# Patient Record
Sex: Female | Born: 1990 | Hispanic: Yes | Marital: Married | Smoking: Never smoker
Health system: Southern US, Community
[De-identification: ages and names within clinical notes are randomized; demographics above are authoritative.]

## PROBLEM LIST (undated history)

## (undated) ENCOUNTER — Inpatient Hospital Stay (HOSPITAL_COMMUNITY): Payer: Self-pay

## (undated) HISTORY — PX: NO PAST SURGERIES: SHX2092

---

## 2016-01-02 ENCOUNTER — Emergency Department (HOSPITAL_COMMUNITY)
Admission: EM | Admit: 2016-01-02 | Discharge: 2016-01-02 | Disposition: A | Discharge status: Home / Self Care | Payer: Self-pay | Attending: Emergency Medicine | Admitting: Emergency Medicine

## 2016-01-02 ENCOUNTER — Encounter (HOSPITAL_COMMUNITY): Payer: Self-pay | Admitting: Family Medicine

## 2016-01-02 ENCOUNTER — Emergency Department (HOSPITAL_COMMUNITY): Payer: Self-pay

## 2016-01-02 DIAGNOSIS — O23511 Infections of cervix in pregnancy, first trimester: Secondary | ICD-10-CM | POA: Insufficient documentation

## 2016-01-02 DIAGNOSIS — R102 Pelvic and perineal pain: Secondary | ICD-10-CM | POA: Insufficient documentation

## 2016-01-02 DIAGNOSIS — Z3490 Encounter for supervision of normal pregnancy, unspecified, unspecified trimester: Secondary | ICD-10-CM

## 2016-01-02 DIAGNOSIS — Z3A12 12 weeks gestation of pregnancy: Secondary | ICD-10-CM | POA: Insufficient documentation

## 2016-01-02 DIAGNOSIS — O26899 Other specified pregnancy related conditions, unspecified trimester: Secondary | ICD-10-CM

## 2016-01-02 DIAGNOSIS — R109 Unspecified abdominal pain: Secondary | ICD-10-CM

## 2016-01-02 DIAGNOSIS — N760 Acute vaginitis: Secondary | ICD-10-CM

## 2016-01-02 DIAGNOSIS — N72 Inflammatory disease of cervix uteri: Secondary | ICD-10-CM

## 2016-01-02 DIAGNOSIS — O26891 Other specified pregnancy related conditions, first trimester: Secondary | ICD-10-CM | POA: Insufficient documentation

## 2016-01-02 DIAGNOSIS — O210 Mild hyperemesis gravidarum: Secondary | ICD-10-CM | POA: Insufficient documentation

## 2016-01-02 DIAGNOSIS — R1013 Epigastric pain: Secondary | ICD-10-CM | POA: Insufficient documentation

## 2016-01-02 DIAGNOSIS — B9689 Other specified bacterial agents as the cause of diseases classified elsewhere: Secondary | ICD-10-CM

## 2016-01-02 DIAGNOSIS — N390 Urinary tract infection, site not specified: Secondary | ICD-10-CM

## 2016-01-02 LAB — URINE MICROSCOPIC-ADD ON: BACTERIA UA: NONE SEEN

## 2016-01-02 LAB — WET PREP, GENITAL
Sperm: NONE SEEN
TRICH WET PREP: NONE SEEN
Yeast Wet Prep HPF POC: NONE SEEN

## 2016-01-02 LAB — URINALYSIS, ROUTINE W REFLEX MICROSCOPIC
BILIRUBIN URINE: NEGATIVE
GLUCOSE, UA: NEGATIVE mg/dL
HGB URINE DIPSTICK: NEGATIVE
KETONES UR: 40 mg/dL — AB
Nitrite: NEGATIVE
PROTEIN: NEGATIVE mg/dL
Specific Gravity, Urine: 1.023 (ref 1.005–1.030)
pH: 7.5 (ref 5.0–8.0)

## 2016-01-02 LAB — CBC
HCT: 35 % — ABNORMAL LOW (ref 36.0–46.0)
Hemoglobin: 12.3 g/dL (ref 12.0–15.0)
MCH: 28.1 pg (ref 26.0–34.0)
MCHC: 35.1 g/dL (ref 30.0–36.0)
MCV: 80.1 fL (ref 78.0–100.0)
PLATELETS: 205 10*3/uL (ref 150–400)
RBC: 4.37 MIL/uL (ref 3.87–5.11)
RDW: 12.1 % (ref 11.5–15.5)
WBC: 7.5 10*3/uL (ref 4.0–10.5)

## 2016-01-02 LAB — COMPREHENSIVE METABOLIC PANEL
ALBUMIN: 4.1 g/dL (ref 3.5–5.0)
ALT: 8 U/L — ABNORMAL LOW (ref 14–54)
ANION GAP: 8 (ref 5–15)
AST: 18 U/L (ref 15–41)
Alkaline Phosphatase: 67 U/L (ref 38–126)
BILIRUBIN TOTAL: 0.4 mg/dL (ref 0.3–1.2)
BUN: 10 mg/dL (ref 6–20)
CALCIUM: 9.4 mg/dL (ref 8.9–10.3)
CHLORIDE: 104 mmol/L (ref 101–111)
CO2: 23 mmol/L (ref 22–32)
CREATININE: 0.55 mg/dL (ref 0.44–1.00)
GLUCOSE: 89 mg/dL (ref 65–99)
POTASSIUM: 3.7 mmol/L (ref 3.5–5.1)
Sodium: 135 mmol/L (ref 135–145)
Total Protein: 7 g/dL (ref 6.5–8.1)

## 2016-01-02 LAB — LIPASE, BLOOD: Lipase: 42 U/L (ref 11–51)

## 2016-01-02 LAB — HCG, QUANTITATIVE, PREGNANCY: HCG, BETA CHAIN, QUANT, S: 108227 m[IU]/mL — AB (ref <5)

## 2016-01-02 LAB — I-STAT BETA HCG BLOOD, ED (MC, WL, AP ONLY): I-stat hCG, quantitative: >2000 m[IU]/mL — ABNORMAL HIGH (ref <5)

## 2016-01-02 LAB — RAPID HIV SCREEN (HIV 1/2 AB+AG)
HIV 1/2 ANTIBODIES: NONREACTIVE
HIV-1 P24 Antigen - HIV24: NONREACTIVE

## 2016-01-02 MED ORDER — SODIUM CHLORIDE 0.9 % IV BOLUS (SEPSIS)
1000.0000 mL | Freq: Once | INTRAVENOUS | Status: AC
  Administered 2016-01-02: 1000 mL via INTRAVENOUS

## 2016-01-02 MED ORDER — CEPHALEXIN 500 MG PO CAPS: 1000.0000 mg | ORAL_CAPSULE | Freq: Two times a day (BID) | ORAL | 0 refills | Status: DC

## 2016-01-02 MED ORDER — ALUM & MAG HYDROXIDE-SIMETH 200-200-20 MG/5ML PO SUSP
15.0000 mL | Freq: Once | ORAL | Status: AC
Start: 2016-01-02 — End: 2016-01-02
  Administered 2016-01-02: 15 mL via ORAL
  Filled 2016-01-02: qty 30

## 2016-01-02 MED ORDER — AZITHROMYCIN 250 MG PO TABS
1000.0000 mg | ORAL_TABLET | Freq: Once | ORAL | Status: AC
  Administered 2016-01-02: 1000 mg via ORAL
  Filled 2016-01-02: qty 4

## 2016-01-02 MED ORDER — METRONIDAZOLE 0.75 % VA GEL: 1.0000 | Freq: Two times a day (BID) | VAGINAL | 0 refills | Status: DC

## 2016-01-02 MED ORDER — LIDOCAINE HCL (PF) 1 % IJ SOLN
INTRAMUSCULAR | Status: AC
  Administered 2016-01-02: 5 mL
  Filled 2016-01-02: qty 5

## 2016-01-02 MED ORDER — CEFTRIAXONE SODIUM 250 MG IJ SOLR
250.0000 mg | Freq: Once | INTRAMUSCULAR | Status: AC
  Administered 2016-01-02: 250 mg via INTRAMUSCULAR
  Filled 2016-01-02: qty 250

## 2016-01-02 MED ORDER — CALCIUM CARBONATE ANTACID 600 MG PO CHEW: 600.0000 mg | CHEWABLE_TABLET | Freq: Two times a day (BID) | ORAL | 0 refills | Status: DC

## 2016-01-02 NOTE — ED Provider Notes (Signed)
[facility]-EMERGENCY DEPT Provider Note   CSN: [id number] Arrival date & time: 01/02/16  1229  First Provider Contact:  None       History   Chief Complaint Chief Complaint  Patient presents with  . Emesis  . Abdominal Pain    HPI Cindy House is a 25 y.o. female.  HPI   25 year old Hispanic female presenting for evaluation of abdominal discomfort with nausea vomiting diarrhea.  History obtain through PA student who speaks Spanish.  Pt report having postprandial epigastric abdominal discomfort associated with nausea and vomiting for the past week. She reported her pain as a burning sensation, moderate intensity sensitivity, worsened after eating. Vomitus is nonbloody nonbilious report occasional loose stools. She also report having some urinary discomfort last week with mild odor which has since resolved. Does report mild suprapubic tenderness for the past 2 days. States that her last menstrual period was 06/20. She believes she may be pregnant but have not tested yet. She has 1 child. Patient denies having fever, chills, productive cough, chest pain, shortness of breath, back pain, dysuria, hematuria, vaginal bleeding, vaginal discharge, or rash. No new sexual partners.   History reviewed. No pertinent past medical history.  There are no active problems to display for this patient.   History reviewed. No pertinent surgical history.  OB History    No data available       Home Medications    Prior to Admission medications   Not on File    Family History History reviewed. No pertinent family history.  Social History Social History  Substance Use Topics  . Smoking status: Never Smoker  . Smokeless tobacco: Never Used  . Alcohol use No     Allergies   Review of patient's allergies indicates no known allergies.   Review of Systems Review of Systems  All other systems reviewed and are negative.    Physical Exam Updated Vital Signs BP 112/65 (BP  Location: Right Arm)   Pulse 89   Temp 98.2 F (36.8 C) (Oral)   Resp 14   LMP 11/22/2015   SpO2 99%   Physical Exam  Constitutional: She appears well-developed and well-nourished. No distress.  Hispanic female laying in bed in no acute discomfort.  HENT:  Head: Atraumatic.  Eyes: Conjunctivae are normal.  Neck: Neck supple.  Cardiovascular: Normal rate and regular rhythm.   Pulmonary/Chest: Effort normal and breath sounds normal.  Abdominal: Soft. There is tenderness (Mild epigastric tenderness without guarding or rebound tenderness. Mild suprapubic tenderness without guarding or rebound tenderness.).  Genitourinary:  Genitourinary Comments: Pelvic exam: RN in room as chaperone, external female genitalia normal with no signs of lesions or injuries. Speculum exam shows normal cervix with mild dystrophic skin changes at the T-zone with mild discharge. Bimanual exam with mild L adnexal tenderness, no cervical motion tenderness, uterus normal size and nontender, no masses appreciated. The external cervical os is closed.   Neurological: She is alert.  Skin: No rash noted.  Psychiatric: She has a normal mood and affect.  Nursing note and vitals reviewed.    ED Treatments / Results  Labs (all labs ordered are listed, but only abnormal results are displayed) Labs Reviewed  WET PREP, GENITAL - Abnormal; Notable for the following:       Result Value   Clue Cells Wet Prep HPF POC PRESENT (*)    WBC, Wet Prep HPF POC MANY (*)    All other components within normal limits  COMPREHENSIVE METABOLIC PANEL -  Abnormal; Notable for the following:    ALT 8 (*)    All other components within normal limits  CBC - Abnormal; Notable for the following:    HCT 35.0 (*)    All other components within normal limits  URINALYSIS, ROUTINE W REFLEX MICROSCOPIC (NOT AT [facility]) - Abnormal; Notable for the following:    APPearance CLOUDY (*)    Ketones, ur 40 (*)    Leukocytes, UA MODERATE (*)    All other  components within normal limits  HCG, QUANTITATIVE, PREGNANCY - Abnormal; Notable for the following:    hCG, Beta Chain, Quant, S 108,227 (*)    All other components within normal limits  URINE MICROSCOPIC-ADD ON - Abnormal; Notable for the following:    Squamous Epithelial / LPF 0-5 (*)    All other components within normal limits  I-STAT BETA HCG BLOOD, ED ([facility], [facility], [facility] ONLY) - Abnormal; Notable for the following:    I-stat hCG, quantitative >2,000.0 (*)    All other components within normal limits  LIPASE, BLOOD  RAPID HIV SCREEN (HIV 1/2 AB+AG)  RPR  GC/CHLAMYDIA PROBE AMP ([place]) NOT AT [facility]    EKG  EKG Interpretation None       Radiology US Ob Limited  Result Date: 01/02/2016 CLINICAL DATA:  25 year old female with abdominal pain in the first trimester of pregnancy. Initial encounter. Quantitative beta HCG [id number]. EXAM: OBSTETRIC <14 WK [place] AND TRANSVAGINAL OB US TECHNIQUE: Both transabdominal and transvaginal ultrasound examinations were performed for complete evaluation of the gestation as well as the maternal uterus, adnexal regions, and pelvic cul-de-sac. Transvaginal technique was performed to assess early pregnancy. COMPARISON:  None. FINDINGS: Intrauterine gestational sac: Single Yolk sac:  Visible Embryo:  Visible Cardiac Activity: Detected Heart Rate: 136  bpm CRL:  12.1  mm   7 w   3 d                  [place] EDC: 08/17/2016 Subchorionic hemorrhage: 2 areas of subchorionic hemorrhage suspected measuring 2.2 cm (image 58) and 1.4 cm (image 59). Maternal uterus/adnexae: No pelvic free fluid. The right ovary measures 3.7 x 2.8 x 2.5 cm in probably contains the corpus luteum (image 73). The left ovary is only seen transabdominally and appears normal measuring 2.5 x 1.9 x 2.9 cm. IMPRESSION: 1.  Single living IUP demonstrated. 2. Two areas of subchorionic hemorrhage identified measuring up to 2 cm each. No pelvic free fluid. Electronically Signed   By: Odessa Fleming M.D.   On:  01/02/2016 19:24   US Ob Transvaginal  Result Date: 01/02/2016 CLINICAL DATA:  25 year old female with abdominal pain in the first trimester of pregnancy. Initial encounter. Quantitative beta HCG [id number]. EXAM: OBSTETRIC <14 WK [place] AND TRANSVAGINAL OB US TECHNIQUE: Both transabdominal and transvaginal ultrasound examinations were performed for complete evaluation of the gestation as well as the maternal uterus, adnexal regions, and pelvic cul-de-sac. Transvaginal technique was performed to assess early pregnancy. COMPARISON:  None. FINDINGS: Intrauterine gestational sac: Single Yolk sac:  Visible Embryo:  Visible Cardiac Activity: Detected Heart Rate: 136  bpm CRL:  12.1  mm   7 w   3 d                  [place] EDC: 08/17/2016 Subchorionic hemorrhage: 2 areas of subchorionic hemorrhage suspected measuring 2.2 cm (image 58) and 1.4 cm (image 59). Maternal uterus/adnexae: No pelvic free fluid. The right ovary measures 3.7 x 2.8 x 2.5 cm  in probably contains the corpus luteum (image 73). The left ovary is only seen transabdominally and appears normal measuring 2.5 x 1.9 x 2.9 cm. IMPRESSION: 1.  Single living IUP demonstrated. 2. Two areas of subchorionic hemorrhage identified measuring up to 2 cm each. No pelvic free fluid. Electronically Signed   By: Odessa Fleming M.D.   On: 01/02/2016 19:24    Procedures Procedures (including critical care time)  Medications Ordered in ED Medications  cefTRIAXone (ROCEPHIN) injection 250 mg (not administered)  azithromycin (ZITHROMAX) tablet 1,000 mg (not administered)  alum & mag hydroxide-simeth (MAALOX/MYLANTA) 200-200-20 MG/5ML suspension 15 mL (15 mLs Oral Given 01/02/16 1741)  sodium chloride 0.9 % bolus 1,000 mL (0 mLs Intravenous Stopped 01/02/16 1844)     Initial Impression / Assessment and Plan / ED Course  I have reviewed the triage vital signs and the nursing notes.  Pertinent labs & imaging results that were available during my care of the patient were reviewed by  me and considered in my medical decision making (see chart for details).  Clinical Course    BP 113/59   Pulse 66   Temp 98.2 F (36.8 C) (Oral)   Resp 14   LMP 11/22/2015   SpO2 100%    Final Clinical Impressions(s) / ED Diagnoses   Final diagnoses:  Abdominal pain during pregnancy  BV (bacterial vaginosis)  Hyperemesis gravidarum  UTI (lower urinary tract infection)  Cervicitis    New Prescriptions New Prescriptions   No medications on file   4:01 PM Patient here with nausea vomiting for the past week. To be brought on with eating. She is also found to have a positive pregnancy test. Her last menstrual period was June 24. Suspects her symptoms seem be hyperemesis gravidarum. Patient however appears comfortable and is currently eating cookies in the room without any discomfort.  She does have mild epigastric tenderness to suprapubic tenderness. On pelvic examination she does have left adnexal tenderness without any obvious mass and no active vaginal bleeding. Will perform transvaginal ultrasound to rule out ectopic and to assess for IUP. Patient otherwise well-appearing with stable normal vital sign.  7:51 PM Ultrasound demonstrate an intrauterine pregnancy at approximately 12 weeks which corresponds with patient's Quant hCG. Wet prep shows evidence of clue cells and many bacteria. She does have left adnexal tenderness therefore patient we treated for potential STD with Rocephin and Zithromax. She will also be treated for bacterial vaginosis with metronidazole gel. She will need to follow-up closely with open hospital for further management of her pregnancy. Patient will also be discharged with Keflex as treatment for urine tract infection. Return precaution discussed.  At this time, patient states her symptoms is much improved after receiving Maalox. She has been eating a moderate amount of food while in the ER and has not vomited once.   Fayrene Helper, PA-C 01/02/16 2007      Mancel Bale, MD 01/03/16 [phone]

## 2016-01-02 NOTE — ED Triage Notes (Signed)
Pt here for N,V,D x 1 month. LMP June 24th. Generalized abd pain. sts possible pregnancy. BP 106/69, SPO 99 RA pulse 83 RR18. CBG 113

## 2016-01-02 NOTE — ED Notes (Signed)
Pt called 2x to reassess vitals

## 2016-01-02 NOTE — ED Notes (Signed)
Pt. Eating and drinking with no issues at this time.

## 2016-01-02 NOTE — ED Notes (Signed)
Pt. Transported to ultrasound at this time.  

## 2016-01-02 NOTE — ED Notes (Addendum)
Per interpretor lines pt having vomiting and abd pain x 1 week. sts LMP June 24th. Denies pregnancy test. sts she has 1 child. sts pain is epigastric. Pt denies any vaginal bleeding or discharge. Denies urinary symptoms.

## 2016-01-02 NOTE — ED Notes (Signed)
Pt. Given drink by EDP student.

## 2016-01-02 NOTE — Discharge Instructions (Signed)
You are [redacted] weeks pregnant.  Please followup at [facility] for further management of your pregnancy.  You have a urinary tract infection.  Take Keflex as prescribed.  You also have heart burn, eat small meals, avoid spicy food and take Maalox to help with your symptoms.  You have bacterial vaginosis, use MetroGel cream twice daily for the next 1 week as treatment.  Return if you have any concerns.

## 2016-01-03 LAB — RPR: RPR: NONREACTIVE

## 2016-01-05 LAB — GC/CHLAMYDIA PROBE AMP (~~LOC~~) NOT AT ARMC
CHLAMYDIA, DNA PROBE: NEGATIVE
Neisseria Gonorrhea: NEGATIVE

## 2016-03-29 LAB — OB RESULTS CONSOLE GC/CHLAMYDIA
Chlamydia: NEGATIVE
Gonorrhea: NEGATIVE

## 2016-03-29 LAB — OB RESULTS CONSOLE VARICELLA ZOSTER ANTIBODY, IGG: Varicella: NON-IMMUNE/NOT IMMUNE

## 2016-03-29 LAB — OB RESULTS CONSOLE ANTIBODY SCREEN: Antibody Screen: NEGATIVE

## 2016-03-29 LAB — CYSTIC FIBROSIS DIAGNOSTIC STUDY: INTERPRETATION-CFDNA: NEGATIVE

## 2016-03-29 LAB — OB RESULTS CONSOLE ABO/RH: RH Type: POSITIVE

## 2016-03-29 LAB — OB RESULTS CONSOLE RUBELLA ANTIBODY, IGM: RUBELLA: IMMUNE

## 2016-03-29 LAB — OB RESULTS CONSOLE HIV ANTIBODY (ROUTINE TESTING): HIV: NONREACTIVE

## 2016-03-29 LAB — OB RESULTS CONSOLE HEPATITIS B SURFACE ANTIGEN: Hepatitis B Surface Ag: NEGATIVE

## 2016-04-07 ENCOUNTER — Encounter: Payer: Self-pay | Admitting: *Deleted

## 2016-04-13 ENCOUNTER — Encounter: Payer: Self-pay | Admitting: Obstetrics & Gynecology

## 2016-04-13 ENCOUNTER — Ambulatory Visit (INDEPENDENT_AMBULATORY_CARE_PROVIDER_SITE_OTHER): Payer: Self-pay | Admitting: Obstetrics & Gynecology

## 2016-04-13 VITALS — BP 109/55 | HR 79 | Ht 63.75 in | Wt 116.1 lb

## 2016-04-13 DIAGNOSIS — O26892 Other specified pregnancy related conditions, second trimester: Secondary | ICD-10-CM

## 2016-04-13 DIAGNOSIS — N898 Other specified noninflammatory disorders of vagina: Secondary | ICD-10-CM

## 2016-04-13 DIAGNOSIS — O09212 Supervision of pregnancy with history of pre-term labor, second trimester: Secondary | ICD-10-CM

## 2016-04-13 DIAGNOSIS — O0990 Supervision of high risk pregnancy, unspecified, unspecified trimester: Secondary | ICD-10-CM

## 2016-04-13 DIAGNOSIS — Z8751 Personal history of pre-term labor: Secondary | ICD-10-CM | POA: Insufficient documentation

## 2016-04-13 LAB — POCT URINALYSIS DIP (DEVICE)
BILIRUBIN URINE: NEGATIVE
Glucose, UA: NEGATIVE mg/dL
HGB URINE DIPSTICK: NEGATIVE
KETONES UR: NEGATIVE mg/dL
Nitrite: NEGATIVE
PH: 7 (ref 5.0–8.0)
Protein, ur: NEGATIVE mg/dL
SPECIFIC GRAVITY, URINE: 1.02 (ref 1.005–1.030)
Urobilinogen, UA: 0.2 mg/dL (ref 0.0–1.0)

## 2016-04-13 LAB — WET PREP, GENITAL
Trich, Wet Prep: NONE SEEN
YEAST WET PREP: NONE SEEN

## 2016-04-13 MED ORDER — HYDROXYPROGESTERONE CAPROATE 250 MG/ML IM OIL: 250.0000 mg | TOPICAL_OIL | INTRAMUSCULAR | Status: DC

## 2016-04-13 NOTE — Progress Notes (Signed)
Spanish video interpreter "Bevely PalmerMarianela" [phone]

## 2016-04-13 NOTE — Progress Notes (Signed)
  [facility]    Cindy House is a G2P0101 282w0d being seen today for her first obstetrical visit.  Her obstetrical history is significant for history of PTB at approximately 35 weeks. Patient does intend to breast feed. Pregnancy history fully reviewed.  Patient reports vaginal irritation and and discharge, odor.  Vitals:   04/13/16 1007 04/13/16 1015  BP: (!) 109/55   Pulse: 79   Weight: 116 lb 1.6 oz (52.7 kg)   Height:  5' 3.75" (1.619 m)    HISTORY: OB History  Gravida Para Term Preterm AB Living  2 1   1   1   SAB TAB Ectopic Multiple Live Births          1    # Outcome Date GA Lbr Len/2nd Weight Sex Delivery Anes PTL Lv  2 Current           1 Preterm 06/23/14 330w0d  4 lb 11 oz (2.126 kg) M Vag-Spont None Y LIV     Past Medical History:  Diagnosis Date  . Preterm labor    Past Surgical History:  Procedure Laterality Date  . NO PAST SURGERIES     Family History  Problem Relation Age of Onset  . Diabetes Mother      Exam    Uterus:     Pelvic Exam:    Perineum: No Hemorrhoids   Vulva: normal   Vagina:  normal mucosa, thin grey discharge   pH:     Cervix: no lesions   Adnexa: not evaluated   Bony Pelvis: average  System: Breast:      Skin: normal coloration and turgor, no rashes    Neurologic: oriented, normal mood   Extremities: normal strength, tone, and muscle mass   HEENT extra ocular movement intact   Mouth/Teeth dental hygiene good   Neck supple   Cardiovascular: regular rate and rhythm   Respiratory:  appears well, vitals normal, no respiratory distress, acyanotic, normal RR   Abdomen: gravid   Urinary: urethral meatus normal      Assessment:    Pregnancy: G2P0101 Patient Active Problem List   Diagnosis Date Noted  . History of preterm delivery 04/13/2016  . Supervision of high risk pregnancy, antepartum 04/13/2016        Plan:     Initial labs drawn. Prenatal vitamins. Problem list reviewed and  updated.  Ultrasound discussed; fetal survey: results [facility]  Follow up in 2 weeks. 50% of 30 min visit spent on counseling and coordination of care.  Start [facility] asap, [facility]   ARNOLD,JAMES 04/13/2016

## 2016-04-14 ENCOUNTER — Ambulatory Visit (HOSPITAL_COMMUNITY)
Admission: RE | Admit: 2016-04-14 | Discharge: 2016-04-14 | Disposition: A | Discharge status: Home / Self Care | Payer: Self-pay | Source: Ambulatory Visit | Attending: Obstetrics & Gynecology | Admitting: Obstetrics & Gynecology

## 2016-04-14 ENCOUNTER — Other Ambulatory Visit: Payer: Self-pay | Admitting: Obstetrics & Gynecology

## 2016-04-14 DIAGNOSIS — Z363 Encounter for antenatal screening for malformations: Secondary | ICD-10-CM

## 2016-04-14 DIAGNOSIS — Z8751 Personal history of pre-term labor: Secondary | ICD-10-CM

## 2016-04-14 DIAGNOSIS — Z3A22 22 weeks gestation of pregnancy: Secondary | ICD-10-CM | POA: Insufficient documentation

## 2016-04-14 DIAGNOSIS — O09212 Supervision of pregnancy with history of pre-term labor, second trimester: Secondary | ICD-10-CM

## 2016-04-14 DIAGNOSIS — Z3686 Encounter for antenatal screening for cervical length: Secondary | ICD-10-CM

## 2016-04-15 ENCOUNTER — Telehealth: Payer: Self-pay | Admitting: *Deleted

## 2016-04-15 DIAGNOSIS — N760 Acute vaginitis: Principal | ICD-10-CM

## 2016-04-15 DIAGNOSIS — B9689 Other specified bacterial agents as the cause of diseases classified elsewhere: Secondary | ICD-10-CM

## 2016-04-15 MED ORDER — METRONIDAZOLE 0.75 % VA GEL: 1.0000 | Freq: Every day | VAGINAL | 0 refills | Status: DC

## 2016-04-15 MED ORDER — METRONIDAZOLE 0.75 % VA GEL: 1.0000 | Freq: Every day | VAGINAL | 0 refills | Status: AC

## 2016-04-15 NOTE — Telephone Encounter (Signed)
Per Dr. Debroah LoopArnold need to tell patient needs flagy for BV. Called patient with interpreter Cindy House and informed her she has bv again and needs to take flagyl again- she requests metrogel like she had before. RX sent to health department in [place] as defaulted, called and was told she is not client there. [facility] [place]  She voices understanding.

## 2016-04-20 ENCOUNTER — Ambulatory Visit: Payer: Self-pay | Admitting: *Deleted

## 2016-04-20 NOTE — Progress Notes (Signed)
Patient presented to clinic for first 17-p injection. There was no 17-p available for her. [facility] There was no record of receiving the order in their system. Was advised to send the order again. Order was faxed, patient advised we will call her when the med arrives.

## 2016-04-21 ENCOUNTER — Telehealth: Payer: Self-pay | Admitting: *Deleted

## 2016-04-21 NOTE — Telephone Encounter (Signed)
[facility] stating that pt is "off label" in that she was already [redacted] wks EGA @ the time of her application. They are not able to provide assistance for the medication  from their program.

## 2016-04-28 ENCOUNTER — Encounter: Payer: Self-pay | Admitting: *Deleted

## 2016-04-28 ENCOUNTER — Ambulatory Visit (INDEPENDENT_AMBULATORY_CARE_PROVIDER_SITE_OTHER): Payer: Self-pay | Admitting: Family Medicine

## 2016-04-28 VITALS — BP 116/58 | HR 86 | Wt 117.5 lb

## 2016-04-28 DIAGNOSIS — O0990 Supervision of high risk pregnancy, unspecified, unspecified trimester: Secondary | ICD-10-CM

## 2016-04-28 DIAGNOSIS — Z8751 Personal history of pre-term labor: Secondary | ICD-10-CM

## 2016-04-28 DIAGNOSIS — O09212 Supervision of pregnancy with history of pre-term labor, second trimester: Secondary | ICD-10-CM

## 2016-04-28 NOTE — Progress Notes (Signed)
Used interpreter [employer].Still having some nausea.

## 2016-04-28 NOTE — Patient Instructions (Signed)
Segundo trimestre de embarazo (Second Trimester of Pregnancy) El segundo trimestre va desde la semana13 hasta la 28, desde el cuarto hasta el sexto mes, y suele ser el momento en el que mejor se siente. Su organismo se ha adaptado a estar embarazada y comienza a sentirse fsicamente mejor. En general, las nuseas matutinas han disminuido o han desaparecido completamente, puede tener ms energa y un aumento de apetito. El segundo trimestre es tambin la poca en la que el feto se desarrolla rpidamente. Hacia el final del sexto mes, el feto mide aproximadamente 9pulgadas (23cm) y pesa alrededor de 1 libras (700g). Es probable que sienta que el beb se mueve (da pataditas) entre las 18 y 20semanas del embarazo. CAMBIOS EN EL ORGANISMO Su organismo atraviesa por muchos cambios durante el embarazo, y estos varan de una mujer a otra.  Seguir aumentando de peso. Notar que la parte baja del abdomen sobresale.  Podrn aparecer las primeras estras en las caderas, el abdomen y las mamas.  Es posible que tenga dolores de cabeza que pueden aliviarse con los medicamentos que el mdico le permita tomar.  Tal vez tenga necesidad de orinar con ms frecuencia porque el feto est ejerciendo presin sobre la vejiga.  Debido al embarazo podr sentir acidez estomacal con frecuencia.  Puede estar estreida, ya que ciertas hormonas enlentecen los movimientos de los msculos que empujan los desechos a travs de los intestinos.  Pueden aparecer hemorroides o abultarse e hincharse las venas (venas varicosas).  Puede tener dolor de espalda que se debe al aumento de peso y a que las hormonas del embarazo relajan las articulaciones entre los huesos de la pelvis, y como consecuencia de la modificacin del peso y los msculos que mantienen el equilibrio.  Las mamas seguirn creciendo y le dolern.  Las encas pueden sangrar y estar sensibles al cepillado y al hilo dental.  Pueden aparecer zonas oscuras o  manchas (cloasma, mscara del embarazo) en el rostro que probablemente se atenuar despus del nacimiento del beb.  Es posible que se forme una lnea oscura desde el ombligo hasta la zona del pubis (linea nigra) que probablemente se atenuar despus del nacimiento del beb.  Tal vez haya cambios en el cabello que pueden incluir su engrosamiento, crecimiento rpido y cambios en la textura. Adems, a algunas mujeres se les cae el cabello durante o despus del embarazo, o tienen el cabello seco o fino. Lo ms probable es que el cabello se le normalice despus del nacimiento del beb. QU DEBE ESPERAR EN LAS CONSULTAS PRENATALES Durante una visita prenatal de rutina:  La pesarn para asegurarse de que usted y el feto estn creciendo normalmente.  Le tomarn la presin arterial.  Le medirn el abdomen para controlar el desarrollo del beb.  Se escucharn los latidos cardacos fetales.  Se evaluarn los resultados de los estudios solicitados en visitas anteriores. El mdico puede preguntarle lo siguiente:  Cmo se siente.  Si siente los movimientos del beb.  Si ha tenido sntomas anormales, como prdida de lquido, sangrado, dolores de cabeza intensos o clicos abdominales.  Si est consumiendo algn producto que contenga tabaco, como cigarrillos, tabaco de mascar y cigarrillos electrnicos.  Si tiene alguna pregunta. Otros estudios que podrn realizarse durante el segundo trimestre incluyen lo siguiente:  Anlisis de sangre para detectar lo siguiente:  Concentraciones de hierro bajas (anemia).  Diabetes gestacional (entre la semana 24 y la 28).  Anticuerpos Rh.  Anlisis de orina para detectar infecciones, diabetes o protenas en la orina.    Una ecografa para confirmar que el beb crece y se desarrolla correctamente.  Una amniocentesis para diagnosticar posibles problemas genticos.  Estudios del feto para descartar espina bfida y sndrome de Down.  Prueba del VIH (virus  de inmunodeficiencia humana). Los exmenes prenatales de rutina incluyen la prueba de deteccin del VIH, a menos que decida no realizrsela. INSTRUCCIONES PARA EL CUIDADO EN EL HOGAR  Evite fumar, consumir hierbas, beber alcohol y tomar frmacos que no le hayan recetado. Estas sustancias qumicas afectan la formacin y el desarrollo del beb.  No consuma ningn producto que contenga tabaco, lo que incluye cigarrillos, tabaco de mascar y cigarrillos electrnicos. Si necesita ayuda para dejar de fumar, consulte al mdico. Puede recibir asesoramiento y otro tipo de recursos para dejar de fumar.  Siga las indicaciones del mdico en relacin con el uso de medicamentos. Durante el embarazo, hay medicamentos que son seguros de tomar y otros que no.  Haga ejercicio solamente como se lo haya indicado el mdico. Sentir clicos uterinos es un buen signo para detener la actividad fsica.  Contine comiendo alimentos sanos con regularidad.  Use un sostn que le brinde buen soporte si le duelen las mamas.  No se d baos de inmersin en agua caliente, baos turcos ni saunas.  Use el cinturn de seguridad en todo momento mientras conduce.  No coma carne cruda ni queso sin cocinar; evite el contacto con las bandejas sanitarias de los gatos y la tierra que estos animales usan. Estos elementos contienen grmenes que pueden causar defectos congnitos en el beb.  Tome las vitaminas prenatales.  Tome entre 1500 y 2000mg de calcio diariamente comenzando en la semana20 del embarazo hasta el parto.  Si est estreida, pruebe un laxante suave (si el mdico lo autoriza). Consuma ms alimentos ricos en fibra, como vegetales y frutas frescos y cereales integrales. Beba gran cantidad de lquido para mantener la orina de tono claro o color amarillo plido.  Dese baos de asiento con agua tibia para aliviar el dolor o las molestias causadas por las hemorroides. Use una crema para las hemorroides si el mdico la  autoriza.  Si tiene venas varicosas, use medias de descanso. Eleve los pies durante 15minutos, 3 o 4veces por da. Limite el consumo de sal en su dieta.  No levante objetos pesados, use zapatos de tacones bajos y mantenga una buena postura.  Descanse con las piernas elevadas si tiene calambres o dolor de cintura.  Visite a su dentista si an no lo ha hecho durante el embarazo. Use un cepillo de dientes blando para higienizarse los dientes y psese el hilo dental con suavidad.  Puede seguir manteniendo relaciones sexuales, a menos que el mdico le indique lo contrario.  Concurra a todas las visitas prenatales segn las indicaciones de su mdico. SOLICITE ATENCIN MDICA SI:  Tiene mareos.  Siente clicos leves, presin en la pelvis o dolor persistente en el abdomen.  Tiene nuseas, vmitos o diarrea persistentes.  Observa una secrecin vaginal con mal olor.  Siente dolor al orinar. SOLICITE ATENCIN MDICA DE INMEDIATO SI:  Tiene fiebre.  Tiene una prdida de lquido por la vagina.  Tiene sangrado o pequeas prdidas vaginales.  Siente dolor intenso o clicos en el abdomen.  Sube o baja de peso rpidamente.  Tiene dificultad para respirar y siente dolor de pecho.  Sbitamente se le hinchan mucho el rostro, las manos, los tobillos, los pies o las piernas.  No ha sentido los movimientos del beb durante una hora.  Siente un   dolor de cabeza intenso que no se alivia con medicamentos.  Su visin se modifica. Esta informacin no tiene como fin reemplazar el consejo del mdico. Asegrese de hacerle al mdico cualquier pregunta que tenga. Document Released: 02/24/2005 Document Revised: 06/07/2014 Document Reviewed: 07/18/2012 Elsevier Interactive Patient Education  2017 [employer]   Lactancia materna (Breastfeeding) Decidir amamantar es una de las mejores elecciones que puede hacer por usted y su beb. El cambio hormonal durante el embarazo produce el desarrollo del  tejido mamario y aumenta la cantidad y el tamao de los conductos galactforos. Estas hormonas tambin permiten que las protenas, los azcares y las grasas de la sangre produzcan la leche materna en las glndulas productoras de leche. Las hormonas impiden que la leche materna sea liberada antes del nacimiento del beb, adems de impulsar el flujo de leche luego del nacimiento. Una vez que ha comenzado a amamantar, pensar en el beb, as como la succin o el llanto, pueden estimular la liberacin de leche de las glndulas productoras de leche. LOS BENEFICIOS DE AMAMANTAR Para el beb   La primera leche (calostro) ayuda a mejorar el funcionamiento del sistema digestivo del beb.  La leche tiene anticuerpos que ayudan a prevenir las infecciones en el beb.  El beb tiene una menor incidencia de asma, alergias y del sndrome de muerte sbita del lactante.  Los nutrientes en la leche materna son mejores para el beb que la leche maternizada y estn preparados exclusivamente para cubrir las necesidades del beb.  La leche materna mejora el desarrollo cerebral del beb.  Es menos probable que el beb desarrolle otras enfermedades, como obesidad infantil, asma o diabetes mellitus de tipo 2. Para usted   La lactancia materna favorece el desarrollo de un vnculo muy especial entre la madre y el beb.  Es conveniente. La leche materna siempre est disponible a la temperatura correcta y es econmica.  La lactancia materna ayuda a quemar caloras y a perder el peso ganado durante el embarazo.  Favorece la contraccin del tero al tamao que tena antes del embarazo de manera ms rpida y disminuye el sangrado (loquios) despus del parto.  La lactancia materna contribuye a reducir el riesgo de desarrollar diabetes mellitus de tipo 2, osteoporosis o cncer de mama o de ovario en el futuro. SIGNOS DE QUE EL BEB EST HAMBRIENTO Primeros signos de hambre   Aumenta su estado de alerta o actividad.  Se  estira.  Mueve la cabeza de un lado a otro.  Mueve la cabeza y abre la boca cuando se le toca la mejilla o la comisura de la boca (reflejo de bsqueda).  Aumenta las vocalizaciones, tales como sonidos de succin, se relame los labios, emite arrullos, suspiros, o chirridos.  Mueve la mano hacia la boca.  Se chupa con ganas los dedos o las manos. Signos tardos de hambre   Est agitado.  Llora de manera intermitente. Signos de hambre extrema  Los signos de hambre extrema requerirn que lo calme y lo consuele antes de que el beb pueda alimentarse adecuadamente. No espere a que se manifiesten los siguientes signos de hambre extrema para comenzar a amamantar:  Agitacin.  Llanto intenso y fuerte.  Gritos. INFORMACIN BSICA SOBRE LA LACTANCIA MATERNA Iniciacin de la lactancia materna   Encuentre un lugar cmodo para sentarse o acostarse, con un buen respaldo para el cuello y la espalda.  Coloque una almohada o una manta enrollada debajo del beb para acomodarlo a la altura de la mama (si est sentada).   Las almohadas para amamantar se han diseado especialmente a fin de servir de apoyo para los brazos y el beb mientras amamanta.  Asegrese de que el abdomen del beb est frente al suyo.  Masajee suavemente la mama. Con las yemas de los dedos, masajee la pared del pecho hacia el pezn en un movimiento circular. Esto estimula el flujo de leche. Es posible que deba continuar este movimiento mientras amamanta si la leche fluye lentamente.  Sostenga la mama con el pulgar por arriba del pezn y los otros 4 dedos por debajo de la mama. Asegrese de que los dedos se encuentren lejos del pezn y de la boca del beb.  Empuje suavemente los labios del beb con el pezn o con el dedo.  Cuando la boca del beb se abra lo suficiente, acrquelo rpidamente a la mama e introduzca todo el pezn y la zona oscura que lo rodea (areola), tanto como sea posible, dentro de la boca del beb.  Debe  haber ms areola visible por arriba del labio superior del beb que por debajo del labio inferior.  La lengua del beb debe estar entre la enca inferior y la mama.  Asegrese de que la boca del beb est en la posicin correcta alrededor del pezn (prendida). Los labios del beb deben crear un sello sobre la mama y estar doblados hacia afuera (invertidos).  Es comn que el beb succione durante 2 a 3 minutos para que comience el flujo de leche materna. Cmo debe prenderse  Es muy importante que le ensee al beb cmo prenderse adecuadamente a la mama. Si el beb no se prende adecuadamente, puede causarle dolor en el pezn y reducir la produccin de leche materna, y hacer que el beb tenga un escaso aumento de peso. Adems, si el beb no se prende adecuadamente al pezn, puede tragar aire durante la alimentacin. Esto puede causarle molestias al beb. Hacer eructar al beb al cambiar de mama puede ayudarlo a liberar el aire. Sin embargo, ensearle al beb cmo prenderse a la mama adecuadamente es la mejor manera de evitar que se sienta molesto por tragar aire mientras se alimenta. Signos de que el beb se ha prendido adecuadamente al pezn:  Tironea o succiona de modo silencioso, sin causarle dolor.  Se escucha que traga cada 3 o 4 succiones.  Hay movimientos musculares por arriba y por delante de sus odos al succionar. Signos de que el beb no se ha prendido adecuadamente al pezn:  Hace ruidos de succin o de chasquido mientras se alimenta.  Siente dolor en el pezn. Si cree que el beb no se prendi correctamente, deslice el dedo en la comisura de la boca y colquelo entre las encas del beb para interrumpir la succin. Intente comenzar a amamantar nuevamente. Signos de lactancia materna exitosa  Signos del beb:  Disminuye gradualmente el nmero de succiones o cesa la succin por completo.  Se duerme.  Relaja el cuerpo.  Retiene una pequea cantidad de leche en la boca.  Se  desprende solo del pecho. Signos que presenta usted:  Las mamas han aumentado la firmeza, el peso y el tamao 1 a 3 horas despus de amamantar.  Estn ms blandas inmediatamente despus de amamantar.  Un aumento del volumen de leche, y tambin un cambio en su consistencia y color se producen hacia el quinto da de lactancia materna.  Los pezones no duelen, ni estn agrietados ni sangran. Signos de que su beb recibe la cantidad de leche suficiente   Mojar   por lo menos 1 o 2 paales durante las primeras 24 horas despus del nacimiento.  Mojar por lo menos 5 o 6 paales cada 24 horas durante la primera semana despus del nacimiento. La orina debe ser transparente o de color amarillo plido a los 5 das despus del nacimiento.  Mojar entre 6 y 8 paales cada 24 horas a medida que el beb sigue creciendo y desarrollndose.  Defeca al menos 3 veces en 24 horas a los 5 das de vida. La materia fecal debe ser blanda y amarillenta.  Defeca al menos 3 veces en 24 horas a los 7 das de vida. La materia fecal debe ser grumosa y amarillenta.  No registra una prdida de peso mayor del 10% del peso al nacer durante los primeros 3 das de vida.  Aumenta de peso un promedio de 4 a 7onzas (113 a 198g) por semana despus de los 4 das de vida.  Aumenta de peso, diariamente, de manera uniforme a partir de los 5 das de vida, sin registrar prdida de peso despus de las 2semanas de vida. Despus de alimentarse, es posible que el beb regurgite una pequea cantidad. Esto es frecuente. FRECUENCIA Y DURACIN DE LA LACTANCIA MATERNA El amamantamiento frecuente la ayudar a producir ms leche y a prevenir problemas de dolor en los pezones e hinchazn en las mamas. Alimente al beb cuando muestre signos de hambre o si siente la necesidad de reducir la congestin de las mamas. Esto se denomina "lactancia a demanda". Evite el uso del chupete mientras trabaja para establecer la lactancia (las primeras 4 a 6  semanas despus del nacimiento del beb). Despus de este perodo, podr ofrecerle un chupete. Las investigaciones demostraron que el uso del chupete durante el primer ao de vida del beb disminuye el riesgo de desarrollar el sndrome de muerte sbita del lactante (SMSL). Permita que el nio se alimente en cada mama todo lo que desee. Contine amamantando al beb hasta que haya terminado de alimentarse. Cuando el beb se desprende o se queda dormido mientras se est alimentando de la primera mama, ofrzcale la segunda. Debido a que, con frecuencia, los recin nacidos permanecen somnolientos las primeras semanas de vida, es posible que deba despertar al beb para alimentarlo. Los horarios de lactancia varan de un beb a otro. Sin embargo, las siguientes reglas pueden servir como gua para ayudarla a garantizar que el beb se alimenta adecuadamente:  Se puede amamantar a los recin nacidos (bebs de 4 semanas o menos de vida) cada 1 a 3 horas.  No deben transcurrir ms de 3 horas durante el da o 5 horas durante la noche sin que se amamante a los recin nacidos.  Debe amamantar al beb 8 veces como mnimo en un perodo de 24 horas, hasta que comience a introducir slidos en su dieta, a los 6 meses de vida aproximadamente. EXTRACCIN DE LECHE MATERNA La extraccin y el almacenamiento de la leche materna le permiten asegurarse de que el beb se alimente exclusivamente de leche materna, aun en momentos en los que no puede amamantar. Esto tiene especial importancia si debe regresar al trabajo en el perodo en que an est amamantando o si no puede estar presente en los momentos en que el beb debe alimentarse. Su asesor en lactancia puede orientarla sobre cunto tiempo es seguro almacenar leche materna. El sacaleche es un aparato que le permite extraer leche de la mama a un recipiente estril. Luego, la leche materna extrada puede almacenarse en un refrigerador o congelador.   Algunos sacaleches son manuales,  mientras que otros son elctricos. Consulte a su asesor en lactancia qu tipo ser ms conveniente para usted. Los sacaleches se pueden comprar; sin embargo, algunos hospitales y grupos de apoyo a la lactancia materna alquilan sacaleches mensualmente. Un asesor en lactancia puede ensearle cmo extraer leche materna manualmente, en caso de que prefiera no usar un sacaleche. CMO CUIDAR LAS MAMAS DURANTE LA LACTANCIA MATERNA Los pezones se secan, agrietan y duelen durante la lactancia materna. Las siguientes recomendaciones pueden ayudarla a mantener las mamas humectadas y sanas:  Evite usar jabn en los pezones.  Use un sostn de soporte. Aunque no son esenciales, las camisetas sin mangas o los sostenes especiales para amamantar estn diseados para acceder fcilmente a las mamas, para amamantar sin tener que quitarse todo el sostn o la camiseta. Evite usar sostenes con aro o sostenes muy ajustados.  Seque al aire sus pezones durante 3 a 4minutos despus de amamantar al beb.  Utilice solo apsitos de algodn en el sostn para absorber las prdidas de leche. La prdida de un poco de leche materna entre las tomas es normal.  Utilice lanolina sobre los pezones luego de amamantar. La lanolina ayuda a mantener la humedad normal de la piel. Si usa lanolina pura, no tiene que lavarse los pezones antes de volver a alimentar al beb. La lanolina pura no es txica para el beb. Adems, puede extraer manualmente algunas gotas de leche materna y masajear suavemente esa leche sobre los pezones, para que la leche se seque al aire. Durante las primeras semanas despus de dar a luz, algunas mujeres pueden experimentar hinchazn en las mamas (congestin mamaria). La congestin puede hacer que sienta las mamas pesadas, calientes y sensibles al tacto. El pico de la congestin ocurre dentro de los 3 a 5 das despus del parto. Las siguientes recomendaciones pueden ayudarla a aliviar la congestin:  Vace por completo  las mamas al amamantar o extraer leche. Puede aplicar calor hmedo en las mamas (en la ducha o con toallas hmedas para manos) antes de amamantar o extraer leche. Esto aumenta la circulacin y ayuda a que la leche fluya. Si el beb no vaca por completo las mamas cuando lo amamanta, extraiga la leche restante despus de que haya finalizado.  Use un sostn ajustado (para amamantar o comn) o una camiseta sin mangas durante 1 o 2 das para indicar al cuerpo que disminuya ligeramente la produccin de leche.  Aplique compresas de hielo sobre las mamas, a menos que le resulte demasiado incmodo.  Asegrese de que el beb est prendido y se encuentre en la posicin correcta mientras lo alimenta. Si la congestin persiste luego de 48 horas o despus de seguir estas recomendaciones, comunquese con su mdico o un asesor en lactancia. RECOMENDACIONES GENERALES PARA EL CUIDADO DE LA SALUD DURANTE LA LACTANCIA MATERNA  Consuma alimentos saludables. Alterne comidas y colaciones, y coma 3 de cada una por da. Dado que lo que come afecta la leche materna, es posible que algunas comidas hagan que su beb se vuelva ms irritable de lo habitual. Evite comer este tipo de alimentos si percibe que afectan de manera negativa al beb.  Beba leche, jugos de fruta y agua para satisfacer su sed (aproximadamente 10 vasos al da).  Descanse con frecuencia, reljese y tome sus vitaminas prenatales para evitar la fatiga, el estrs y la anemia.  Contine con los autocontroles de la mama.  Evite masticar y fumar tabaco. Las sustancias qumicas de los cigarrillos que pasan   a la leche materna y la exposicin al humo ambiental del tabaco pueden daar al beb.  No consuma alcohol ni drogas, incluida la marihuana. Algunos medicamentos, que pueden ser perjudiciales para el beb, pueden pasar a travs de la leche materna. Es importante que consulte a su mdico antes de tomar cualquier medicamento, incluidos todos los medicamentos  recetados y de venta libre, as como los suplementos vitamnicos y herbales. Puede quedar embarazada durante la lactancia. Si desea controlar la natalidad, consulte a su mdico cules son las opciones ms seguras para el beb. SOLICITE ATENCIN MDICA SI:  Usted siente que quiere dejar de amamantar o se siente frustrada con la lactancia.  Siente dolor en las mamas o en los pezones.  Sus pezones estn agrietados o sangran.  Sus pechos estn irritados, sensibles o calientes.  Tiene un rea hinchada en cualquiera de las mamas.  Siente escalofros o fiebre.  Tiene nuseas o vmitos.  Presenta una secrecin de otro lquido distinto de la leche materna de los pezones.  Sus mamas no se llenan antes de amamantar al beb para el quinto da despus del parto.  Se siente triste y deprimida.  El beb est demasiado somnoliento como para comer bien.  El beb tiene problemas para dormir.  Moja menos de 3 paales en 24 horas.  Defeca menos de 3 veces en 24 horas.  La piel del beb o la parte blanca de los ojos se vuelven amarillentas.  El beb no ha aumentado de peso a los 5 das de vida. SOLICITE ATENCIN MDICA DE INMEDIATO SI:  El beb est muy cansado (letargo) y no se quiere despertar para comer.  Le sube la fiebre sin causa. Esta informacin no tiene como fin reemplazar el consejo del mdico. Asegrese de hacerle al mdico cualquier pregunta que tenga. Document Released: 05/17/2005 Document Revised: 09/08/2015 Document Reviewed: 11/08/2012 Elsevier Interactive Patient Education  2017 [employer]  

## 2016-04-28 NOTE — Progress Notes (Signed)
   PRENATAL VISIT NOTE  Subjective:  Cindy House is a 25 y.o. G2P0101 at 5919w1d being seen today for ongoing prenatal care.  She is currently monitored for the following issues for this high-risk pregnancy and has History of preterm delivery and Supervision of high risk pregnancy, antepartum on her problem list.  Patient reports no complaints.  Contractions: Not present. Vag. Bleeding: None.  Movement: Present. Denies leaking of fluid.   The following portions of the patient's history were reviewed and updated as appropriate: allergies, current medications, past family history, past medical history, past social history, past surgical history and problem list. Problem list updated.  Objective:   Vitals:   04/28/16 1358  BP: (!) 116/58  Pulse: 86  Weight: 117 lb 8 oz (53.3 kg)    Fetal Status: Fetal Heart Rate (bpm): 145 Fundal Height: 25 cm Movement: Present     General:  Alert, oriented and cooperative. Patient is in no acute distress.  Skin: Skin is warm and dry. No rash noted.   Cardiovascular: Normal heart rate noted  Respiratory: Normal respiratory effort, no problems with respiration noted  Abdomen: Soft, gravid, appropriate for gestational age. Pain/Pressure: Absent     Pelvic:  Cervical exam deferred        Extremities: Normal range of motion.  Edema: None  Mental Status: Normal mood and affect. Normal behavior. Normal judgment and thought content.   Assessment and Plan:  Pregnancy: G2P0101 at 4619w1d  1. History of preterm delivery Not a candidate for 17 P, declines prometrium  2. Supervision of high risk pregnancy, antepartum 28 wk labs and TDaP at next visit.  Preterm labor symptoms and general obstetric precautions including but not limited to vaginal bleeding, contractions, leaking of fluid and fetal movement were reviewed in detail with the patient. Please refer to After Visit Summary for other counseling recommendations.  Return in about 4 weeks (around  05/26/2016) for 28 wk labs.   Reva Boresanya S Jaydian Santana, MD

## 2016-05-12 ENCOUNTER — Encounter: Payer: Self-pay | Admitting: General Practice

## 2016-05-26 ENCOUNTER — Encounter: Payer: Self-pay | Admitting: Family

## 2016-05-31 NOTE — L&D Delivery Note (Signed)
Delivery Note  Delivery of a viable female at 360426 by CNM in LOA position no nuchal cord Cord double clamped after cessation of pulsation, cut by FOB Cord blood sample collected   Third Stage: Placenta delivered Duncan intact with 3 VC @ 0431 Placenta disposition: L&D Uterine tone firm / bleeding large- IM pitocin and cytotec given   Left labial laceration identified  Anesthesia for repair: none Repair none Est. Blood Loss (mL): 400cc  Complications: none  Mom to postpartum.  Baby to Couplet care / Skin to Skin.  Newborn: Birth Weight: pending  Apgar Scores: 8/9 Feeding planned: breast  Steward DroneVeronica Rogers BSN, SNM 08/07/2016, 4:47 AM  Patient is a G2P0101 at 7044w4d who was admitted in transition, significant hx of preterm delivery at 35-36wks but otherwise uncomplicated prenatal course.  She progressed without augmentation to SVD.  I was gloved and present for delivery in its entirety.  Second stage of labor progressed, baby delivered after pushing approx 25 mins.  Mild decels during second stage noted.  Complications: none  Lacerations: left labial, not repaired  EBL: 400cc (given rectal cytotec 800mcg due to brisk bldg immed PP and no IV placement)  SHAW, KIMBERLY, CNM 10:00 AM  08/07/2016

## 2016-06-07 ENCOUNTER — Ambulatory Visit (INDEPENDENT_AMBULATORY_CARE_PROVIDER_SITE_OTHER): Payer: Self-pay | Admitting: Obstetrics & Gynecology

## 2016-06-07 VITALS — BP 110/60 | HR 83 | Wt 127.5 lb

## 2016-06-07 DIAGNOSIS — O0990 Supervision of high risk pregnancy, unspecified, unspecified trimester: Secondary | ICD-10-CM

## 2016-06-07 DIAGNOSIS — O09213 Supervision of pregnancy with history of pre-term labor, third trimester: Secondary | ICD-10-CM

## 2016-06-07 DIAGNOSIS — Z8751 Personal history of pre-term labor: Secondary | ICD-10-CM

## 2016-06-07 DIAGNOSIS — O0992 Supervision of high risk pregnancy, unspecified, second trimester: Secondary | ICD-10-CM

## 2016-06-07 LAB — CBC
HEMATOCRIT: 31.1 % — AB (ref 35.0–45.0)
Hemoglobin: 10.2 g/dL — ABNORMAL LOW (ref 11.7–15.5)
MCH: 29.3 pg (ref 27.0–33.0)
MCHC: 32.8 g/dL (ref 32.0–36.0)
MCV: 89.4 fL (ref 80.0–100.0)
MPV: 9.3 fL (ref 7.5–12.5)
Platelets: 224 10*3/uL (ref 140–400)
RBC: 3.48 MIL/uL — AB (ref 3.80–5.10)
RDW: 12.8 % (ref 11.0–15.0)
WBC: 9.5 10*3/uL (ref 3.8–10.8)

## 2016-06-07 NOTE — Progress Notes (Signed)
   PRENATAL VISIT NOTE  Subjective:  Cindy House is a 26 y.o. G2P0101 at 9268w6d being seen today for ongoing prenatal care.  She is currently monitored for the following issues for this high-risk pregnancy and has History of preterm delivery and Supervision of high risk pregnancy, antepartum on her problem list.  Patient reports low abdominal pressure.   .  .  Movement: Present. Denies leaking of fluid.   The following portions of the patient's history were reviewed and updated as appropriate: allergies, current medications, past family history, past medical history, past social history, past surgical history and problem list. Problem list updated.  Objective:   Vitals:   06/07/16 0842  BP: 110/60  Pulse: 83  Weight: 127 lb 8 oz (57.8 kg)    Fetal Status: Fetal Heart Rate (bpm): 142   Movement: Present     General:  Alert, oriented and cooperative. Patient is in no acute distress.  Skin: Skin is warm and dry. No rash noted.   Cardiovascular: Normal heart rate noted  Respiratory: Normal respiratory effort, no problems with respiration noted  Abdomen: Soft, gravid, appropriate for gestational age. Pain/Pressure: Present     Pelvic:  Cervical exam deferred        Extremities: Normal range of motion.     Mental Status: Normal mood and affect. Normal behavior. Normal judgment and thought content.   Assessment and Plan:  Pregnancy: G2P0101 at 1468w6d  1. Supervision of high risk pregnancy in second trimester  - 2Hr GTT w/ 1 Hr Carpenter 75 g - HIV antibody - RPR - CBC  2. Supervision of high risk pregnancy, antepartum   3. History of preterm delivery   Preterm labor symptoms and general obstetric precautions including but not limited to vaginal bleeding, contractions, leaking of fluid and fetal movement were reviewed in detail with the patient. Please refer to After Visit Summary for other counseling recommendations.  Return in about 2 weeks (around  06/21/2016).   Adam PhenixJames G Arnold, MD

## 2016-06-07 NOTE — Patient Instructions (Signed)
Informacin sobre el parto prematuro  (Preterm Labor Information)  Se llama parto prematuro cuando se inicia antes de las 37 semanas de embarazo. La duracin de un embarazo normal es de 39 a 41 semanas.  CAUSAS  Generalmente las causas del parto prematuro no se conocen. La causa ms frecuente conocida es una infeccin.  FACTORES DE RIESGO   Historia previa de parto prematuro.  Romper la bolsa de aguas antes de tiempo.  La placenta cubre la abertura del cuello.  La placenta se despega del tero.  El cuello es demasiado dbil para contener al beb en el tero.  Hay mucho lquido en el saco amnitico.  Consumo de drogas o hbito de fumar durante el embarazo.  No aumentar de peso lo suficiente durante el embarazo.  Mujeres menores de 18 aos o mayores de 35 aos.  Tener bajos ingresos.  Pertenecer a la raza afroamericana. SNTOMAS   Clicos similares a los menstruales, dolor en el vientre (abdominal) o dolor en la espalda.  Contracciones regulares, tan frecuentes como seis en una hora. Pueden ser suaves o dolorosas.  Contracciones que comienzan en la parte superior del vientre. Luego bajan hacia la zona inferior del vientre y hacia la espalda.  Presin en la zona inferior del vientre que parece empeorar.  Sangrado que proviene de la vagina.  Prdida de lquido por la vagina. TRATAMIENTO  El tratamiento depende de:   Su estado.  El estado del beb.  Cuntas semanas tiene de embarazo. El mdico podr indicarle:   Medicamentos para detener las contracciones.  Que permanezca en la cama excepto para ir al bao (reposo en cama).  Que permanezca en el hospital. QU DEBE HACER SI PIENSA QUE EST EN TRABAJO DE PARTO PREMATURO?  Comunquese con su mdico de inmediato. Debe concurrir al hospital para ser controlada inmediatamente.  CMO PUEDE EVITAR EL TRABAJO DE PARTO PREMATURO EN FUTUROS EMBARAZOS?   Si fuma, abandone el hbito.  Mantenga un aumento de peso  saludable.  Notome drogas ni manipule sustancias qumicas que no necesita.  Informe a su mdico si piensa que tiene una infeccin.  Informe a su mdico si tuvo un trabajo de parto prematuro anteriormente. Esta informacin no tiene como fin reemplazar el consejo del mdico. Asegrese de hacerle al mdico cualquier pregunta que tenga. Document Released: 06/19/2010 Document Revised: 01/17/2013 Elsevier Interactive Patient Education  2017 Elsevier Inc.  

## 2016-06-08 LAB — 2HR GTT W 1 HR, CARPENTER, 75 G
GLUCOSE, FASTING, GEST: 74 mg/dL (ref 65–91)
Glucose, 1 Hr, Gest: 122 mg/dL (ref <180)
Glucose, 2 Hr, Gest: 102 mg/dL (ref <153)

## 2016-06-08 LAB — HIV ANTIBODY (ROUTINE TESTING W REFLEX): HIV 1&2 Ab, 4th Generation: NONREACTIVE

## 2016-06-08 LAB — RPR

## 2016-06-23 ENCOUNTER — Encounter: Payer: Self-pay | Admitting: Obstetrics and Gynecology

## 2016-06-23 NOTE — Progress Notes (Signed)
Patient did not keep OB appointment for 06/23/2016.  Cindy House, Jr MD Attending [facility](Faculty Practice)

## 2016-06-25 ENCOUNTER — Encounter (HOSPITAL_COMMUNITY): Payer: Self-pay

## 2016-06-25 ENCOUNTER — Inpatient Hospital Stay (HOSPITAL_COMMUNITY)
Admission: AD | Admit: 2016-06-25 | Discharge: 2016-06-25 | Disposition: A | Discharge status: Home / Self Care | Payer: Self-pay | Source: Ambulatory Visit | Attending: Obstetrics & Gynecology | Admitting: Obstetrics & Gynecology

## 2016-06-25 DIAGNOSIS — R300 Dysuria: Secondary | ICD-10-CM | POA: Insufficient documentation

## 2016-06-25 DIAGNOSIS — O26893 Other specified pregnancy related conditions, third trimester: Secondary | ICD-10-CM | POA: Insufficient documentation

## 2016-06-25 DIAGNOSIS — O2243 Hemorrhoids in pregnancy, third trimester: Secondary | ICD-10-CM | POA: Insufficient documentation

## 2016-06-25 DIAGNOSIS — O9989 Other specified diseases and conditions complicating pregnancy, childbirth and the puerperium: Secondary | ICD-10-CM

## 2016-06-25 DIAGNOSIS — K649 Unspecified hemorrhoids: Secondary | ICD-10-CM

## 2016-06-25 DIAGNOSIS — R1030 Lower abdominal pain, unspecified: Secondary | ICD-10-CM | POA: Insufficient documentation

## 2016-06-25 DIAGNOSIS — Z3A32 32 weeks gestation of pregnancy: Secondary | ICD-10-CM | POA: Insufficient documentation

## 2016-06-25 LAB — URINALYSIS, ROUTINE W REFLEX MICROSCOPIC
BILIRUBIN URINE: NEGATIVE
Glucose, UA: NEGATIVE mg/dL
Hgb urine dipstick: NEGATIVE
KETONES UR: NEGATIVE mg/dL
Nitrite: NEGATIVE
PROTEIN: NEGATIVE mg/dL
Specific Gravity, Urine: 1.017 (ref 1.005–1.030)
pH: 7 (ref 5.0–8.0)

## 2016-06-25 LAB — FETAL FIBRONECTIN: Fetal Fibronectin: NEGATIVE

## 2016-06-25 NOTE — MAU Note (Signed)
Pt has had lower abdominal cramping since this morning. She also states she felt like something was protruding from her vagina and it has been very painful x 3 days. No bleeding or LOF

## 2016-06-25 NOTE — Discharge Instructions (Signed)
Hemorroides (Hemorrhoids) Las hemorroides son venas inflamadas adentro o alrededor del recto o del ano. Las hemorroides pueden causar dolor, picazn o hemorragias. Generalmente no causan problemas graves. Con frecuencia mejoran al [employer] dieta, el estilo de vida y otros tratamientos Facilities manager. CUIDADOS EN EL HOGAR Comida y bebida   Consuma alimentos que contengan fibra, como cereales integrales, porotos, frutos secos, frutas y verduras. Pregntele a su mdico acerca de tomar productos con fibra aadida en ellos (complementos defibra).  Beba suficiente lquido para mantener el pis (orina) claro o de color amarillo plido. En caso de dolor e hinchazn   Tome un bao de agua tibia (bao de asiento) durante 20 minutos para Engineer, materials. Hgalo 3 o 4veces al da.  Si se lo indican, aplique hielo sobre la zona adolorida. Puede ser beneficioso aplicar hielo [employer] baos con agua tibia.  Ponga el hielo en una bolsa plstica.  Coloque una toalla entre la piel y la bolsa de hielo.  Coloque el hielo durante 20 minutos, 2 a 3 veces por da. Instrucciones generales   [employer] de venta libre y los recetados solamente como se lo haya indicado el mdico.  Las cremas y medicamentos recetados que se colocan en el ano (supositorios) se pueden usar o Sales executive se lo hayan indicado.  Haga ejercicio fsico con frecuencia.  Vaya al bao cuando sienta la necesidad de defecar. No espere.  Evite hacer demasiada fuerza al defecar.  Mantenga la zona del ano limpia y [place]. Use papel higinico hmedo o toallas de papel hmedas.  No pase mucho tiempo sentado en el inodoro. SOLICITE AYUDA SI:  Tiene alguno de estos sntomas:  Dolor e hinchazn que no mejoran con el tratamiento o los medicamentos.  Hemorragia que no se detiene.  Dificultad para defecar o imposibilidad de hacerlo.  Dolor o hinchazn en la zona exterior de las hemorroides. Esta informacin no tiene Microbiologist el consejo del mdico. Asegrese de hacerle al mdico cualquier pregunta que tenga. Document Released: 09/11/2012 Document Revised: 09/08/2015 Document Reviewed: 01/29/2015 Elsevier Interactive Patient Education  2017 Elsevier Inc.  SAFE MEDICATIONS IN PREGNANCY  Acne:  Benzoyl Peroxide  Salicylic Acid   Backache/Headache:  Tylenol: 2 regular strength every 4 hours OR        2 Extra strength every 6 hours   Colds/Coughs/Allergies:  Benadryl (alcohol free) 25 mg every 6 hours as needed  Breath right strips  Claritin  Cepacol throat lozenges  Chloraseptic throat spray  Cold-Eeze- up to three times per day  Cough drops, alcohol free  Flonase (by prescription only)  Guaifenesin  Mucinex  Robitussin DM (plain only, alcohol free)  Saline nasal spray/drops  Sudafed (pseudoephedrine) & Actifed * use only after [redacted] weeks gestation and if you do not have high blood pressure  Tylenol  Vicks Vaporub  Zinc lozenges  Zyrtec   Constipation:  Colace  Ducolax suppositories  Fleet enema  Glycerin suppositories  Metamucil  Milk of magnesia  Miralax  Senokot  Smooth move tea   Diarrhea:  Kaopectate  Imodium A-D   *NO pepto Bismol   Hemorrhoids:  Anusol  Anusol HC  Preparation H  Tucks   Indigestion:  Tums  Maalox  Mylanta  Zantac  Pepcid   Insomnia:  Benadryl (alcohol free) 25mg  every 6 hours as needed  Tylenol PM  Unisom, no Gelcaps   Leg Cramps:  Tums  MagGel   Nausea/Vomiting:  Bonine  Dramamine  Emetrol  Ginger  extract  Sea bands  Meclizine  Nausea medication to take during pregnancy:  Unisom (doxylamine succinate 25 mg tablets) Take one tablet daily at bedtime. If symptoms are not adequately controlled, the dose can be increased to a maximum recommended dose of two tablets daily (1/2 tablet in the morning, 1/2 tablet mid-afternoon and one at bedtime).  Vitamin B6 100mg  tablets. Take one tablet twice a day (up to 200 mg per day).    Skin Rashes:  Aveeno products  Benadryl cream or 25mg  every 6 hours as needed  Calamine Lotion  1% cortisone cream   Yeast infection:  Gyne-lotrimin 7  Monistat 7    **If taking multiple medications, please check labels to avoid duplicating the same active ingredients  **take medication as directed on the label  ** Do not exceed 4000 mg of tylenol in 24 hours  **Do not take medications that contain aspirin or ibuprofen

## 2016-06-25 NOTE — MAU Provider Note (Signed)
Obstetric Resident MAU Note  Chief Complaint:  Abdominal Pain and Vaginal Pain   First Provider Initiated Contact with Patient 06/25/16 1254     HPI: Cindy House is a 26 y.o. G2P0101 at 456w3d who presents to maternity admissions reporting lower abdominal cramping and "'bulging skin" within her vagina. She reports some dysuria the other day, but reports that this resolved with increasing her fluid intake. She reports having bowel movements that are difficult to pass at times.   Denies regular contractions, leakage of fluid or vaginal bleeding. Good fetal movement.   Pregnancy Course: [facility] Active Problem List   Diagnosis Date Noted  . History of preterm delivery 04/13/2016  . Supervision of high risk pregnancy, antepartum 04/13/2016    Past Medical History:  Diagnosis Date  . Preterm labor     OB History  Gravida Para Term Preterm AB Living  2 1   1   1   SAB TAB Ectopic Multiple Live Births          1    # Outcome Date GA Lbr Len/2nd Weight Sex Delivery Anes PTL Lv  2 Current           1 Preterm 06/23/14 9567w0d  4 lb 11 oz (2.126 kg) M Vag-Spont None Y LIV      Past Surgical History:  Procedure Laterality Date  . NO PAST SURGERIES      Family History: Family History  Problem Relation Age of Onset  . Diabetes Mother     Social History: Social History  Substance Use Topics  . Smoking status: Never Smoker  . Smokeless tobacco: Never Used  . Alcohol use No    Allergies: No Known Allergies  Prescriptions Prior to Admission  Medication Sig Dispense Refill Last Dose  . Prenatal Vit-Fe Fumarate-FA (PRENATAL VITAMIN PO) Take 1 tablet by mouth daily.   Taking    ROS: Pertinent findings in history of present illness.  Physical Exam  Blood pressure 110/57, pulse 91, temperature 98.1 F (36.7 C), temperature source Oral, resp. rate 16, last menstrual period 11/22/2015. CONSTITUTIONAL: Well-developed, well-nourished female in no acute  distress.  HENT:  Normocephalic, atraumatic, moist mucus membranes EYES: Conjunctivae normal in appearance. No scleral icterus.  NECK: Normal range of motion, supple, no masses SKIN: Skin is warm and dry. No rash noted. Not diaphoretic. No erythema. No pallor. NEUROLGIC: Alert and oriented to person, place, and time. Normal reflexes, muscle tone coordination. No cranial nerve deficit noted. PSYCHIATRIC: Normal mood and affect. Normal behavior. Normal judgment and thought content. CARDIOVASCULAR: Normal heart rate noted, regular rhythm RESPIRATORY: Effort and breath sounds normal, no problems with respiration noted ABDOMEN: Soft, nontender, nondistended, gravid appropriate for gestational age MUSCULOSKELETAL: Normal range of motion. No edema and no tenderness. 2+ distal pulses.  SPECULUM EXAM: NEFG, physiologic discharge, no blood, cervix clean. 1cm long anterior non-thrombosed hemorrhoid noted on the rectum, patient confirmed this is the area she was concerned about.   Dilation: 1 Effacement (%): Thick Cervical Position: Posterior Exam by:: Adabelle Griffiths   FHT:  Baseline 140s , moderate variability, accelerations present, no decelerations Contractions: no regular contractions   Labs: Results for orders placed or performed during the hospital encounter of 06/25/16 (from the past 24 hour(s))  Urinalysis, Routine w reflex microscopic     Status: Abnormal   Collection Time: 06/25/16 12:22 PM  Result Value Ref Range   Color, Urine YELLOW YELLOW   APPearance CLOUDY (A) CLEAR   Specific  Gravity, Urine 1.017 1.005 - 1.030   pH 7.0 5.0 - 8.0   Glucose, UA NEGATIVE NEGATIVE mg/dL   Hgb urine dipstick NEGATIVE NEGATIVE   Bilirubin Urine NEGATIVE NEGATIVE   Ketones, ur NEGATIVE NEGATIVE mg/dL   Protein, ur NEGATIVE NEGATIVE mg/dL   Nitrite NEGATIVE NEGATIVE   Leukocytes, UA MODERATE (A) NEGATIVE   RBC / HPF 0-5 0 - 5 RBC/hpf   WBC, UA 0-5 0 - 5 WBC/hpf   Bacteria, UA FEW (A) NONE SEEN   Squamous  Epithelial / LPF 6-30 (A) NONE SEEN   Mucous PRESENT   Fetal fibronectin     Status: None   Collection Time: 06/25/16  1:00 PM  Result Value Ref Range   Fetal Fibronectin NEGATIVE NEGATIVE    Imaging:  No results found.  MAU Course: Patient presented with abdominal cramping and feeling that she had "bulging skin" within her vagina. Fetal fibronectin and urine were obtained. A speculum exam was performed that showed physiologic discharge, a visually closed cervix. No vaginal bleeding, masses, or other concerning areas. Fetal fibronectin was negative. Urine was not a clean catch, showed moderate leukocyte esterase and a few bacteria.   Assessment: 1. Hemorrhoids, unspecified hemorrhoid type     Plan: Discharge home Discussed sitz baths, use of anasol. Discussed importance of having regular bowel movements with soft stools.  Labor precautions reviewed Follow up with OB provider  Follow-up Information    [facility] Follow up.   Specialty:  Obstetrics and Gynecology Why:  Previously scheduled OB appointment Contact information: [address] [place] [address] [phone]          Allergies as of 06/25/2016   No Known Allergies     Medication List    TAKE these medications   PRENATAL VITAMIN PO Take 1 tablet by mouth daily.       Lise Auer, MD PGY-2 06/25/2016 2:49 PM   OB FELLOW MAU DISCHARGE ATTESTATION  I have seen and examined this patient; I agree with above documentation in the resident's note. Notable 1cm tender hemorrhoid, non-thrombosed. SSE per above, normal physiologic discharge, visually closed. SVE: 1cm/thick/posterior and high.   I personally reviewed the patient's NST today, found to be REACTIVE. 140 bpm, mod var, +accels, no decels. CTX: Rare.   Jen Mow, DO OB Fellow

## 2016-07-05 ENCOUNTER — Ambulatory Visit (INDEPENDENT_AMBULATORY_CARE_PROVIDER_SITE_OTHER): Payer: Self-pay | Admitting: Obstetrics & Gynecology

## 2016-07-05 ENCOUNTER — Encounter: Payer: Self-pay | Admitting: *Deleted

## 2016-07-05 VITALS — BP 109/60 | HR 99 | Wt 134.5 lb

## 2016-07-05 DIAGNOSIS — Z8751 Personal history of pre-term labor: Secondary | ICD-10-CM

## 2016-07-05 DIAGNOSIS — O09213 Supervision of pregnancy with history of pre-term labor, third trimester: Secondary | ICD-10-CM

## 2016-07-05 DIAGNOSIS — O0990 Supervision of high risk pregnancy, unspecified, unspecified trimester: Secondary | ICD-10-CM

## 2016-07-05 DIAGNOSIS — Z23 Encounter for immunization: Secondary | ICD-10-CM

## 2016-07-05 MED ORDER — PRENATAL VITAMINS 0.8 MG PO TABS: 1.0000 | ORAL_TABLET | Freq: Every day | ORAL | 12 refills | Status: DC

## 2016-07-05 NOTE — Patient Instructions (Signed)
Etonogestrel implant Qu es este medicamento? El ETONOGESTREL es un dispositivo anticonceptivo (control de la natalidad). Se utiliza para Neurosurgeonprevenir el embarazo. Se puede utilizar hasta 3 aos. MARCAS COMUNES: Implanon, Nexplanon Qu le debo informar a mi profesional de la salud antes de tomar este medicamento? Necesita saber si usted presenta alguno de los siguientes problemas o situaciones: sangrado vaginal anormal enfermedad vascular o cogulos sanguneos cncer de mama, cervical, heptico depresin diabetes enfermedad de la vescula biliar dolores de cabeza enfermedad cardiaca o ataque cardiaco reciente alta presin sangunea alto nivel de colesterol enfermedad renal enfermedad heptica convulsiones fuma tabaco una reaccin alrgica o inusual al etonogestrel, otras hormonas, anestsicos o antispticos, medicamentos, alimentos, colorantes o conservantes si est embarazada o buscando quedar embarazada si est amamantando a un beb Cmo debo SLM Corporationutilizar este medicamento? Este dispositivo se inserta debajo de la piel en la cara interna de la parte superior del brazo por un profesional de Radiographer, therapeuticla salud. Hable con su pediatra para informarse acerca del uso de este medicamento en nios. Puede requerir atencin especial. Qu sucede si me Azucena Fallenolvido de una dosis? No se aplica en este caso. Qu puede interactuar con este medicamento? No tome esta medicina con ninguno de los siguientes medicamentos: amprenavir bosentano fosamprenavir Esta medicina tambin puede interactuar con los siguientes medicamentos: medicamentos barbitricos para inducir el sueo o tratar convulsiones ciertos medicamentos para las infecciones micticas tales como quetoconazol e itraconazol griseofulvina medicamentos para tratar convulsiones, tales como carbamazepina, felbamato, Agricultural engineeroxcarbazepina, fenitona, topiramato modafinil fenilbutazona rifampicina algunos medicamentos para tratar la infeccin por VIH tales como atazanavir, indinavir,  lopinavir, nelfinavir, tipranavir, ritonavir hierba de [place] qu debo estar atento al usar este medicamento? Este producto no protege contra la infeccin por el VIH (SIDA) u otras enfermedades de transmisin sexual. Usted debe sentir el implante al presionar con las yemas de los dedos sobre la piel donde se insert. Contacte a su mdico si no se siente el implante y [place] un mtodo anticonceptivo no hormonal (como el condn) hasta que el mdico confirma que el implante est en su Environmental consultantlugar. Si siente que el implante puede haber roto o doblado en su brazo, pngase en contacto con su proveedor de atencin mdica. Qu efectos secundarios puedo tener al Boston Scientificutilizar este medicamento? Efectos secundarios que debe informar a su mdico o a Producer, television/film/videosu profesional de la salud tan pronto como sea posible: Therapist, artreacciones alrgicas, como erupcin cutnea, picazn o urticarias, e hinchazn de la cara, los labios o la lengua bultos en las mamas cambios en las emociones o el estado de nimo estado de nimo deprimido sangrado menstrual intenso o Software engineerprolongado dolor, irritacin, hinchazn o Counsellormoretones en el lugar de la insercin Immunologistcicatriz en el lugar de la insercin signos de infeccin en el sitio de insercin tales como fiebre, y enrojecimiento de la piel, Engineer, miningdolor o secrecin signos de Psychiatristembarazo signos y sntomas de un cogulo sanguneo, tales como problemas respiratorios; cambios en la visin; dolor en el pecho; dolor de cabeza severo, repentino; dolor, hinchazn, calor en la pierna; dificultad para hablar; entumecimiento o debilidad repentina de la cara, el brazo o la pierna signos y sntomas de lesin al hgado, como orina amarilla oscura o [place]; sensacin general de estar enfermo o sntomas gripales; heces claras; prdida de apetito; nuseas; dolor en la regin abdominal superior derecha; cansancio o debilidad inusual; color amarillento de los ojos o la piel sangrado vaginal inusual, secrecin signos y sntomas de un derrame cerebral, tales  como cambios en la visin; confusin; dificultad para hablar o entender;  dolores de Corporate treasurercabeza severos; entumecimiento o debilidad repentina de la cara, el brazo o la pierna; problemas al caminar; Psychiatristmareo; prdida del equilibrio o la coordinacinEfectos secundarios que generalmente no requieren Psychologist, prison and probation servicesatencin mdica (infrmelos a su mdico o a Producer, television/film/videosu profesional de la salud si persisten o si son molestos): acn dolor de Retail buyerespalda dolor en las mamas cambios de peso mareos sensacin general de estar enfermo o sntomas gripales dolor de cabeza sangrado menstrual irregular nuseas dolor de garganta irritacin o inflamacin vaginal Dnde debo guardar mi medicina? Este medicamento se administra en hospitales o clnicas y no necesitar guardarlo en su domicilio.  2017 Elsevier/Gold Standard (2015-09-01 00:00:00)

## 2016-07-05 NOTE — Progress Notes (Signed)
   PRENATAL VISIT NOTE  Subjective:  Cindy HarderSonia E Sorto House is a 26 y.o. G2P0101 at 4034w6d being seen today for ongoing prenatal care.  She is currently monitored for the following issues for this high-risk pregnancy and has History of preterm delivery and Supervision of high risk pregnancy, antepartum on her problem list.  Patient reports no complaints.  Contractions: Not present. Vag. Bleeding: None.  Movement: Present. Denies leaking of fluid.   The following portions of the patient's history were reviewed and updated as appropriate: allergies, current medications, past family history, past medical history, past social history, past surgical history and problem list. Problem list updated.  Objective:   Vitals:   07/05/16 1254  BP: 109/60  Pulse: 99  Weight: 134 lb 8 oz (61 kg)    Fetal Status: Fetal Heart Rate (bpm): 125   Movement: Present     General:  Alert, oriented and cooperative. Patient is in no acute distress.  Skin: Skin is warm and dry. No rash noted.   Cardiovascular: Normal heart rate noted  Respiratory: Normal respiratory effort, no problems with respiration noted  Abdomen: Soft, gravid, appropriate for gestational age. Pain/Pressure: Absent     Pelvic:  Cervical exam deferred        Extremities: Normal range of motion.  Edema: None  Mental Status: Normal mood and affect. Normal behavior. Normal judgment and thought content.   Assessment and Plan:  Pregnancy: G2P0101 at 534w6d  1. Supervision of high risk pregnancy, antepartum   2. History of preterm delivery   Preterm labor symptoms and general obstetric precautions including but not limited to vaginal bleeding, contractions, leaking of fluid and fetal movement were reviewed in detail with the patient. Please refer to After Visit Summary for other counseling recommendations.  Return in about 2 weeks (around 07/19/2016).   Adam PhenixJames G Tascha Casares, MD

## 2016-07-21 ENCOUNTER — Ambulatory Visit (INDEPENDENT_AMBULATORY_CARE_PROVIDER_SITE_OTHER): Payer: Self-pay | Admitting: Obstetrics and Gynecology

## 2016-07-21 VITALS — BP 101/53 | HR 85 | Wt 136.6 lb

## 2016-07-21 DIAGNOSIS — Z113 Encounter for screening for infections with a predominantly sexual mode of transmission: Secondary | ICD-10-CM

## 2016-07-21 DIAGNOSIS — Z8751 Personal history of pre-term labor: Secondary | ICD-10-CM

## 2016-07-21 DIAGNOSIS — O0993 Supervision of high risk pregnancy, unspecified, third trimester: Secondary | ICD-10-CM

## 2016-07-21 DIAGNOSIS — O0990 Supervision of high risk pregnancy, unspecified, unspecified trimester: Secondary | ICD-10-CM

## 2016-07-21 DIAGNOSIS — O09213 Supervision of pregnancy with history of pre-term labor, third trimester: Secondary | ICD-10-CM

## 2016-07-21 MED ORDER — PRENATAL VITAMINS 0.8 MG PO TABS: 1.0000 | ORAL_TABLET | Freq: Every day | ORAL | 12 refills | Status: AC

## 2016-07-21 NOTE — Progress Notes (Signed)
   PRENATAL VISIT NOTE  Subjective:  Cindy House is a 26 y.o. G2P0101 at 2231w1d being seen today for ongoing prenatal care.  She is currently monitored for the following issues for this high-risk pregnancy and has History of preterm delivery and Supervision of high risk pregnancy, antepartum on her problem list.  Patient reports no complaints.  Contractions: Not present. Vag. Bleeding: None.  Movement: Present. Denies leaking of fluid.   The following portions of the patient's history were reviewed and updated as appropriate: allergies, current medications, past family history, past medical history, past social history, past surgical history and problem list. Problem list updated.  Objective:   Vitals:   07/21/16 1457  BP: (!) 101/53  Pulse: 85  Weight: 136 lb 9.6 oz (62 kg)    Fetal Status: Fetal Heart Rate (bpm): 147 Fundal Height: 36 cm Movement: Present  Presentation: Vertex  General:  Alert, oriented and cooperative. Patient is in no acute distress.  Skin: Skin is warm and dry. No rash noted.   Cardiovascular: Normal heart rate noted  Respiratory: Normal respiratory effort, no problems with respiration noted  Abdomen: Soft, gravid, appropriate for gestational age. Pain/Pressure: Present     Pelvic:  Cervical exam performed Dilation: 2 Effacement (%): Thick Station: Ballotable  Extremities: Normal range of motion.  Edema: None  Mental Status: Normal mood and affect. Normal behavior. Normal judgment and thought content.   Assessment and Plan:  Pregnancy: G2P0101 at 6031w1d  1. Supervision of high risk pregnancy in third trimester Patient is doing well without complaints Cultures today - GC[facility]Chlamydia probe amp [facility] - Culture, beta strep (group b only) - Prenatal Multivit-Min-Fe-FA (PRENATAL VITAMINS) 0.8 MG tablet; Take 1 tablet by mouth daily.  Dispense: 30 tablet; Refill: 12  2. History of preterm delivery   Preterm labor symptoms and general  obstetric precautions including but not limited to vaginal bleeding, contractions, leaking of fluid and fetal movement were reviewed in detail with the patient. Please refer to After Visit Summary for other counseling recommendations.  No Follow-up on file.   Catalina AntiguaPeggy Chrisy Hillebrand, MD

## 2016-07-22 LAB — GC/CHLAMYDIA PROBE AMP (~~LOC~~) NOT AT ARMC
CHLAMYDIA, DNA PROBE: NEGATIVE
NEISSERIA GONORRHEA: NEGATIVE

## 2016-07-25 LAB — CULTURE, BETA STREP (GROUP B ONLY): STREP GP B CULTURE: NEGATIVE

## 2016-07-29 ENCOUNTER — Ambulatory Visit (INDEPENDENT_AMBULATORY_CARE_PROVIDER_SITE_OTHER): Payer: Self-pay | Admitting: Obstetrics and Gynecology

## 2016-07-29 VITALS — BP 104/61 | HR 95 | Temp 98.2°F | Wt 139.2 lb

## 2016-07-29 DIAGNOSIS — O0993 Supervision of high risk pregnancy, unspecified, third trimester: Secondary | ICD-10-CM

## 2016-07-29 DIAGNOSIS — O0990 Supervision of high risk pregnancy, unspecified, unspecified trimester: Secondary | ICD-10-CM

## 2016-07-29 NOTE — Progress Notes (Signed)
   PRENATAL VISIT NOTE  Subjective:  Cindy House is a 26 y.o. G2P0101 at 7323w2d being seen today for ongoing prenatal care.  She is currently monitored for the following issues for this high-risk pregnancy and has History of preterm delivery and Supervision of high risk pregnancy, antepartum on her problem list.  Patient reports no complaints.  Contractions: Irritability. Vag. Bleeding: None.  Movement: Present. Denies leaking of fluid.   The following portions of the patient's history were reviewed and updated as appropriate: allergies, current medications, past family history, past medical history, past social history, past surgical history and problem list. Problem list updated.  Objective:   Vitals:   07/29/16 1505  BP: 104/61  Pulse: 95  Temp: 98.2 F (36.8 C)  Weight: 139 lb 3.2 oz (63.1 kg)    Fetal Status: Fetal Heart Rate (bpm): 135   Movement: Present     General:  Alert, oriented and cooperative. Patient is in no acute distress.  Skin: Skin is warm and dry. No rash noted.   Cardiovascular: Normal heart rate noted  Respiratory: Normal respiratory effort, no problems with respiration noted  Abdomen: Soft, gravid, appropriate for gestational age. Pain/Pressure: Present     Pelvic:  Cervical exam deferred        Extremities: Normal range of motion.  Edema: None  Mental Status: Normal mood and affect. Normal behavior. Normal judgment and thought content.   Assessment and Plan:  Pregnancy: G2P0101 at 6523w2d  1. Supervision of high risk pregnancy, antepartum Doing well no concerns GBS reviewed and negative Labor precautions given.  Term labor symptoms and general obstetric precautions including but not limited to vaginal bleeding, contractions, leaking of fluid and fetal movement were reviewed in detail with the patient. Please refer to After Visit Summary for other counseling recommendations.  Return in about 1 week (around 08/05/2016) for LOB.   Lorne SkeensNicholas  Michael Schenk, MD

## 2016-07-29 NOTE — Progress Notes (Signed)
Spanish interpreter "Alejandra" [phone]#[id number] used for visit

## 2016-07-29 NOTE — Patient Instructions (Signed)
Third Trimester of Pregnancy The third trimester is from week 28 through week 40 (months 7 through 9). The third trimester is a time when the unborn baby (fetus) is growing rapidly. At the end of the ninth month, the fetus is about 20 inches in length and weighs 6-10 pounds. Body changes during your third trimester Your body will continue to go through many changes during pregnancy. The changes vary from woman to woman. During the third trimester:  Your weight will continue to increase. You can expect to gain 25-35 pounds (11-16 kg) by the end of the pregnancy.  You may begin to get stretch marks on your hips, abdomen, and breasts.  You may urinate more often because the fetus is moving lower into your pelvis and pressing on your bladder.  You may develop or continue to have heartburn. This is caused by increased hormones that slow down muscles in the digestive tract.  You may develop or continue to have constipation because increased hormones slow digestion and cause the muscles that push waste through your intestines to relax.  You may develop hemorrhoids. These are swollen veins (varicose veins) in the rectum that can itch or be painful.  You may develop swollen, bulging veins (varicose veins) in your legs.  You may have increased body aches in the pelvis, back, or thighs. This is due to weight gain and increased hormones that are relaxing your joints.  You may have changes in your hair. These can include thickening of your hair, rapid growth, and changes in texture. Some women also have hair loss during or after pregnancy, or hair that feels dry or thin. Your hair will most likely return to normal after your baby is born.  Your breasts will continue to grow and they will continue to become tender. A yellow fluid (colostrum) may leak from your breasts. This is the first milk you are producing for your baby.  Your belly button may stick out.  You may notice more swelling in your hands,  face, or ankles.  You may have increased tingling or numbness in your hands, arms, and legs. The skin on your belly may also feel numb.  You may feel short of breath because of your expanding uterus.  You may have more problems sleeping. This can be caused by the size of your belly, increased need to urinate, and an increase in your body's metabolism.  You may notice the fetus "dropping," or moving lower in your abdomen (lightening).  You may have increased vaginal discharge.  You may notice your joints feel loose and you may have pain around your pelvic bone.  What to expect at prenatal visits You will have prenatal exams every 2 weeks until week 36. Then you will have weekly prenatal exams. During a routine prenatal visit:  You will be weighed to make sure you and the baby are growing normally.  Your blood pressure will be taken.  Your abdomen will be measured to track your baby's growth.  The fetal heartbeat will be listened to.  Any test results from the previous visit will be discussed.  You may have a cervical check near your due date to see if your cervix has softened or thinned (effaced).  You will be tested for Group B streptococcus. This happens between 35 and 37 weeks.  Your health care provider may ask you:  What your birth plan is.  How you are feeling.  If you are feeling the baby move.  If you have had   any abnormal symptoms, such as leaking fluid, bleeding, severe headaches, or abdominal cramping.  If you are using any tobacco products, including cigarettes, chewing tobacco, and electronic cigarettes.  If you have any questions.  Other tests or screenings that may be performed during your third trimester include:  Blood tests that check for low iron levels (anemia).  Fetal testing to check the health, activity level, and growth of the fetus. Testing is done if you have certain medical conditions or if there are problems during the  pregnancy.  Nonstress test (NST). This test checks the health of your baby to make sure there are no signs of problems, such as the baby not getting enough oxygen. During this test, a belt is placed around your belly. The baby is made to move, and its heart rate is monitored during movement.  What is false labor? False labor is a condition in which you feel small, irregular tightenings of the muscles in the womb (contractions) that usually go away with rest, changing position, or drinking water. These are called Braxton Hicks contractions. Contractions may last for hours, days, or even weeks before true labor sets in. If contractions come at regular intervals, become more frequent, increase in intensity, or become painful, you should see your health care provider. What are the signs of labor?  Abdominal cramps.  Regular contractions that start at 10 minutes apart and become stronger and more frequent with time.  Contractions that start on the top of the uterus and spread down to the lower abdomen and back.  Increased pelvic pressure and dull back pain.  A watery or bloody mucus discharge that comes from the vagina.  Leaking of amniotic fluid. This is also known as your "water breaking." It could be a slow trickle or a gush. Let your health care provider know if it has a color or strange odor. If you have any of these signs, call your health care provider right away, even if it is before your due date. Follow these instructions at home: Medicines  Follow your health care provider's instructions regarding medicine use. Specific medicines may be either safe or unsafe to take during pregnancy.  Take a prenatal vitamin that contains at least 600 micrograms (mcg) of folic acid.  If you develop constipation, try taking a stool softener if your health care provider approves. Eating and drinking  Eat a balanced diet that includes fresh fruits and vegetables, whole grains, good sources of protein  such as meat, eggs, or tofu, and low-fat dairy. Your health care provider will help you determine the amount of weight gain that is right for you.  Avoid raw meat and uncooked cheese. These carry germs that can cause birth defects in the baby.  If you have low calcium intake from food, talk to your health care provider about whether you should take a daily calcium supplement.  Eat four or five small meals rather than three large meals a day.  Limit foods that are high in fat and processed sugars, such as fried and sweet foods.  To prevent constipation: ? Drink enough fluid to keep your urine clear or pale yellow. ? Eat foods that are high in fiber, such as fresh fruits and vegetables, whole grains, and beans. Activity  Exercise only as directed by your health care provider. Most women can continue their usual exercise routine during pregnancy. Try to exercise for 30 minutes at least 5 days a week. Stop exercising if you experience uterine contractions.  Avoid heavy   lifting.  Do not exercise in extreme heat or humidity, or at high altitudes.  Wear low-heel, comfortable shoes.  Practice good posture.  You may continue to have sex unless your health care provider tells you otherwise. Relieving pain and discomfort  Take frequent breaks and rest with your legs elevated if you have leg cramps or low back pain.  Take warm sitz baths to soothe any pain or discomfort caused by hemorrhoids. Use hemorrhoid cream if your health care provider approves.  Wear a good support bra to prevent discomfort from breast tenderness.  If you develop varicose veins: ? Wear support pantyhose or compression stockings as told by your healthcare provider. ? Elevate your feet for 15 minutes, 3-4 times a day. Prenatal care  Write down your questions. Take them to your prenatal visits.  Keep all your prenatal visits as told by your health care provider. This is important. Safety  Wear your seat belt at  all times when driving.  Make a list of emergency phone numbers, including numbers for family, friends, the hospital, and police and fire departments. General instructions  Avoid cat litter boxes and soil used by cats. These carry germs that can cause birth defects in the baby. If you have a cat, ask someone to clean the litter box for you.  Do not travel far distances unless it is absolutely necessary and only with the approval of your health care provider.  Do not use hot tubs, steam rooms, or saunas.  Do not drink alcohol.  Do not use any products that contain nicotine or tobacco, such as cigarettes and e-cigarettes. If you need help quitting, ask your health care provider.  Do not use any medicinal herbs or unprescribed drugs. These chemicals affect the formation and growth of the baby.  Do not douche or use tampons or scented sanitary pads.  Do not cross your legs for long periods of time.  To prepare for the arrival of your baby: ? Take prenatal classes to understand, practice, and ask questions about labor and delivery. ? Make a trial run to the hospital. ? Visit the hospital and tour the maternity area. ? Arrange for maternity or paternity leave through employers. ? Arrange for family and friends to take care of pets while you are in the hospital. ? Purchase a rear-facing car seat and make sure you know how to install it in your car. ? Pack your hospital bag. ? Prepare the baby's nursery. Make sure to remove all pillows and stuffed animals from the baby's crib to prevent suffocation.  Visit your dentist if you have not gone during your pregnancy. Use a soft toothbrush to brush your teeth and be gentle when you floss. Contact a health care provider if:  You are unsure if you are in labor or if your water has broken.  You become dizzy.  You have mild pelvic cramps, pelvic pressure, or nagging pain in your abdominal area.  You have lower back pain.  You have persistent  nausea, vomiting, or diarrhea.  You have an unusual or bad smelling vaginal discharge.  You have pain when you urinate. Get help right away if:  Your water breaks before 37 weeks.  You have regular contractions less than 5 minutes apart before 37 weeks.  You have a fever.  You are leaking fluid from your vagina.  You have spotting or bleeding from your vagina.  You have severe abdominal pain or cramping.  You have rapid weight loss or weight gain.    You have shortness of breath with chest pain.  You notice sudden or extreme swelling of your face, hands, ankles, feet, or legs.  Your baby makes fewer than 10 movements in 2 hours.  You have severe headaches that do not go away when you take medicine.  You have vision changes. Summary  The third trimester is from week 28 through week 40, months 7 through 9. The third trimester is a time when the unborn baby (fetus) is growing rapidly.  During the third trimester, your discomfort may increase as you and your baby continue to gain weight. You may have abdominal, leg, and back pain, sleeping problems, and an increased need to urinate.  During the third trimester your breasts will keep growing and they will continue to become tender. A yellow fluid (colostrum) may leak from your breasts. This is the first milk you are producing for your baby.  False labor is a condition in which you feel small, irregular tightenings of the muscles in the womb (contractions) that eventually go away. These are called Braxton Hicks contractions. Contractions may last for hours, days, or even weeks before true labor sets in.  Signs of labor can include: abdominal cramps; regular contractions that start at 10 minutes apart and become stronger and more frequent with time; watery or bloody mucus discharge that comes from the vagina; increased pelvic pressure and dull back pain; and leaking of amniotic fluid. This information is not intended to replace advice  given to you by your health care provider. Make sure you discuss any questions you have with your health care provider. Document Released: 05/11/2001 Document Revised: 10/23/2015 Document Reviewed: 07/18/2012 Elsevier Interactive Patient Education  2017 [employer]  

## 2016-08-04 ENCOUNTER — Encounter: Payer: Self-pay | Admitting: Advanced Practice Midwife

## 2016-08-04 ENCOUNTER — Ambulatory Visit (INDEPENDENT_AMBULATORY_CARE_PROVIDER_SITE_OTHER): Payer: Self-pay | Admitting: Advanced Practice Midwife

## 2016-08-04 VITALS — BP 119/65 | HR 100 | Wt 141.9 lb

## 2016-08-04 DIAGNOSIS — O09213 Supervision of pregnancy with history of pre-term labor, third trimester: Secondary | ICD-10-CM

## 2016-08-04 DIAGNOSIS — O0990 Supervision of high risk pregnancy, unspecified, unspecified trimester: Secondary | ICD-10-CM

## 2016-08-04 NOTE — Patient Instructions (Signed)

## 2016-08-04 NOTE — Progress Notes (Signed)
   PRENATAL VISIT NOTE  Subjective:  Cindy House is a 26 y.o. G2P0101 at 5045w1d being seen today for ongoing prenatal care.  She is currently monitored for the following issues for this low-risk pregnancy and has History of preterm delivery and Supervision of high risk pregnancy, antepartum on her problem list.  Patient reports no complaints.  Contractions: Irritability.  .  Movement: Present. Denies leaking of fluid.   The following portions of the patient's history were reviewed and updated as appropriate: allergies, current medications, past family history, past medical history, past social history, past surgical history and problem list. Problem list updated.  Objective:   Vitals:   08/04/16 1433  BP: 119/65  Pulse: 100  Weight: 141 lb 14.4 oz (64.4 kg)    Fetal Status: Fetal Heart Rate (bpm): 163   Movement: Present     General:  Alert, oriented and cooperative. Patient is in no acute distress.  Skin: Skin is warm and dry. No rash noted.   Cardiovascular: Normal heart rate noted  Respiratory: Normal respiratory effort, no problems with respiration noted  Abdomen: Soft, gravid, appropriate for gestational age. Pain/Pressure: Present     Pelvic:  Cervical exam deferred        Extremities: Normal range of motion.     Mental Status: Normal mood and affect. Normal behavior. Normal judgment and thought content.   Assessment and Plan:  Pregnancy: G2P0101 at 3945w1d  1. Supervision of high risk pregnancy, antepartum     Reviewed signs of labor  Interpretor via IPAD used   Term labor symptoms and general obstetric precautions including but not limited to vaginal bleeding, contractions, leaking of fluid and fetal movement were reviewed in detail with the patient. Please refer to After Visit Summary for other counseling recommendations.  Return in about 1 week (around 08/11/2016).   Aviva SignsMarie L Dannielle Baskins, CNM

## 2016-08-07 ENCOUNTER — Inpatient Hospital Stay (HOSPITAL_COMMUNITY)
Admission: AD | Admit: 2016-08-07 | Discharge: 2016-08-09 | DRG: 775 | Disposition: A | Discharge status: Home / Self Care | Payer: Medicaid Other | Source: Ambulatory Visit | Attending: Obstetrics & Gynecology | Admitting: Obstetrics & Gynecology

## 2016-08-07 ENCOUNTER — Encounter (HOSPITAL_COMMUNITY): Payer: Self-pay | Admitting: General Practice

## 2016-08-07 DIAGNOSIS — Z3A38 38 weeks gestation of pregnancy: Secondary | ICD-10-CM

## 2016-08-07 DIAGNOSIS — Z3493 Encounter for supervision of normal pregnancy, unspecified, third trimester: Secondary | ICD-10-CM | POA: Diagnosis present

## 2016-08-07 DIAGNOSIS — Z833 Family history of diabetes mellitus: Secondary | ICD-10-CM

## 2016-08-07 MED ORDER — TETANUS-DIPHTH-ACELL PERTUSSIS 5-2.5-18.5 LF-MCG/0.5 IM SUSP: 0.5000 mL | Freq: Once | INTRAMUSCULAR | Status: DC

## 2016-08-07 MED ORDER — ONDANSETRON HCL 4 MG/2ML IJ SOLN: 4.0000 mg | INTRAMUSCULAR | Status: DC | PRN

## 2016-08-07 MED ORDER — ACETAMINOPHEN 325 MG PO TABS
650.0000 mg | ORAL_TABLET | ORAL | Status: DC | PRN
  Administered 2016-08-07: 650 mg via ORAL
  Filled 2016-08-07: qty 2

## 2016-08-07 MED ORDER — ONDANSETRON HCL 4 MG PO TABS: 4.0000 mg | ORAL_TABLET | ORAL | Status: DC | PRN

## 2016-08-07 MED ORDER — OXYCODONE HCL 5 MG PO TABS
5.0000 mg | ORAL_TABLET | ORAL | Status: DC | PRN
  Administered 2016-08-07 (×2): 5 mg via ORAL
  Filled 2016-08-07 (×2): qty 1

## 2016-08-07 MED ORDER — OXYCODONE-ACETAMINOPHEN 5-325 MG PO TABS: 2.0000 | ORAL_TABLET | ORAL | Status: DC | PRN

## 2016-08-07 MED ORDER — DIBUCAINE 1 % RE OINT: 1.0000 "application " | TOPICAL_OINTMENT | RECTAL | Status: DC | PRN

## 2016-08-07 MED ORDER — PRENATAL MULTIVITAMIN CH
1.0000 | ORAL_TABLET | Freq: Every day | ORAL | Status: DC
  Administered 2016-08-07 – 2016-08-09 (×3): 1 via ORAL
  Filled 2016-08-07 (×3): qty 1

## 2016-08-07 MED ORDER — OXYTOCIN 10 UNIT/ML IJ SOLN
10.0000 [IU] | Freq: Once | INTRAMUSCULAR | Status: AC
  Administered 2016-08-07: 10 [IU] via INTRAMUSCULAR

## 2016-08-07 MED ORDER — OXYTOCIN 40 UNITS IN LACTATED RINGERS INFUSION - SIMPLE MED: 2.5000 [IU]/h | INTRAVENOUS | Status: DC

## 2016-08-07 MED ORDER — LACTATED RINGERS IV SOLN: INTRAVENOUS | Status: DC

## 2016-08-07 MED ORDER — OXYCODONE-ACETAMINOPHEN 5-325 MG PO TABS
1.0000 | ORAL_TABLET | ORAL | Status: DC | PRN
  Administered 2016-08-07: 1 via ORAL
  Filled 2016-08-07: qty 1

## 2016-08-07 MED ORDER — LIDOCAINE HCL (PF) 1 % IJ SOLN
30.0000 mL | INTRAMUSCULAR | Status: DC | PRN
  Filled 2016-08-07: qty 30

## 2016-08-07 MED ORDER — DIPHENHYDRAMINE HCL 25 MG PO CAPS: 25.0000 mg | ORAL_CAPSULE | Freq: Four times a day (QID) | ORAL | Status: DC | PRN

## 2016-08-07 MED ORDER — OXYTOCIN BOLUS FROM INFUSION: 500.0000 mL | Freq: Once | INTRAVENOUS | Status: DC

## 2016-08-07 MED ORDER — OXYTOCIN 40 UNITS IN LACTATED RINGERS INFUSION - SIMPLE MED
INTRAVENOUS | Status: AC
  Filled 2016-08-07: qty 1000

## 2016-08-07 MED ORDER — LIDOCAINE HCL (PF) 1 % IJ SOLN
INTRAMUSCULAR | Status: AC
  Filled 2016-08-07: qty 30

## 2016-08-07 MED ORDER — OXYTOCIN 10 UNIT/ML IJ SOLN
INTRAMUSCULAR | Status: AC
  Filled 2016-08-07: qty 1

## 2016-08-07 MED ORDER — ZOLPIDEM TARTRATE 5 MG PO TABS: 5.0000 mg | ORAL_TABLET | Freq: Every evening | ORAL | Status: DC | PRN

## 2016-08-07 MED ORDER — LACTATED RINGERS IV SOLN: 500.0000 mL | INTRAVENOUS | Status: DC | PRN

## 2016-08-07 MED ORDER — IBUPROFEN 600 MG PO TABS
600.0000 mg | ORAL_TABLET | Freq: Four times a day (QID) | ORAL | Status: DC
  Administered 2016-08-07 – 2016-08-09 (×9): 600 mg via ORAL
  Filled 2016-08-07 (×9): qty 1

## 2016-08-07 MED ORDER — COCONUT OIL OIL: 1.0000 "application " | TOPICAL_OIL | Status: DC | PRN

## 2016-08-07 MED ORDER — MISOPROSTOL 200 MCG PO TABS
800.0000 ug | ORAL_TABLET | Freq: Once | ORAL | Status: AC
  Administered 2016-08-07: 800 ug via RECTAL

## 2016-08-07 MED ORDER — BENZOCAINE-MENTHOL 20-0.5 % EX AERO
1.0000 "application " | INHALATION_SPRAY | CUTANEOUS | Status: DC | PRN
  Filled 2016-08-07: qty 56

## 2016-08-07 MED ORDER — ONDANSETRON HCL 4 MG/2ML IJ SOLN: 4.0000 mg | Freq: Four times a day (QID) | INTRAMUSCULAR | Status: DC | PRN

## 2016-08-07 MED ORDER — SOD CITRATE-CITRIC ACID 500-334 MG/5ML PO SOLN: 30.0000 mL | ORAL | Status: DC | PRN

## 2016-08-07 MED ORDER — WITCH HAZEL-GLYCERIN EX PADS: 1.0000 "application " | MEDICATED_PAD | CUTANEOUS | Status: DC | PRN

## 2016-08-07 MED ORDER — ACETAMINOPHEN 325 MG PO TABS: 650.0000 mg | ORAL_TABLET | ORAL | Status: DC | PRN

## 2016-08-07 MED ORDER — SIMETHICONE 80 MG PO CHEW: 80.0000 mg | CHEWABLE_TABLET | ORAL | Status: DC | PRN

## 2016-08-07 MED ORDER — MISOPROSTOL 200 MCG PO TABS
ORAL_TABLET | ORAL | Status: AC
  Filled 2016-08-07: qty 4

## 2016-08-07 MED ORDER — SENNOSIDES-DOCUSATE SODIUM 8.6-50 MG PO TABS
2.0000 | ORAL_TABLET | ORAL | Status: DC
  Administered 2016-08-07 – 2016-08-09 (×3): 2 via ORAL
  Filled 2016-08-07 (×3): qty 2

## 2016-08-07 NOTE — MAU Note (Signed)
Pt presented to MAU very uncomfortable with ctxs. Having a lot of pelvic pressure. Taken to Rm #7 and sve done. Complete and +1 station. BOWB Benji Stanley RN called BS and pt may come to 165. Philipp DeputyKim Shaw CNM notified and in Rm 165 when pt arrived via stretcher.

## 2016-08-07 NOTE — Lactation Note (Signed)
This note was copied from a baby's chart. Lactation Consultation Note  Patient Name: Cindy House [id number]'XToday's Date: 08/07/2016 Reason for consult: Initial assessment Interpreter used. Baby at 12 hr of life. Upon entry baby was sleeping. Mom declined latch help at this visit. Mom reports baby is latching well. She denies breast or nipple pain. She voiced no concerns. She desires to offer breast and formula bottles. Discussed baby behavior, feeding frequency, supplementing, artificial nipples, baby belly size, voids, wt loss, breast changes, and nipple care. She stated she can manually express and has a spoon in the room. Given lactation handouts. Aware of OP services and support group.    Maternal Data Has patient been taught Hand Expression?: Yes Does the patient have breastfeeding experience prior to this delivery?: Yes  Feeding Length of feed: 5 min  LATCH Score/Interventions                      Lactation Tools Discussed/Used WIC Program: Yes   Consult Status Consult Status: Follow-up Date: 08/08/16 Follow-up type: In-patient    Rulon Eisenmengerlizabeth E Markeeta Scalf 08/07/2016, 5:09 PM

## 2016-08-07 NOTE — H&P (Signed)
Cindy House is a 26 y.o. female G2P0101 @ 38.4wks by 7wk scan presenting for onset of reg ctx @ 0100. Denies leaking or bldg. Her preg has been followed by the [facility] office and has been remarkable for 1) prev del @ 35-36wks 2) GBS neg  OB History    Gravida Para Term Preterm AB Living   2 1   1   1    SAB TAB Ectopic Multiple Live Births           1     Past Medical History:  Diagnosis Date  . Preterm labor    Past Surgical History:  Procedure Laterality Date  . NO PAST SURGERIES     Family History: family history includes Diabetes in her mother. Social History:  reports that she has never smoked. She has never used smokeless tobacco. She reports that she does not drink alcohol or use drugs.     Maternal Diabetes: No Genetic Screening: Declined Maternal Ultrasounds/Referrals: Normal Fetal Ultrasounds or other Referrals:  None Maternal Substance Abuse:  No Significant Maternal Medications:  None Significant Maternal Lab Results:  Lab values include: Group B Strep negative Other Comments:  None  ROS History  VS: 97.8, P90, BP 102/82 Last menstrual period 11/22/2015. Exam Physical Exam  Constitutional: She is oriented to person, place, and time. She appears well-developed.  HENT:  Head: Normocephalic.  Neck: Normal range of motion.  Cardiovascular: Normal rate.   Respiratory: Effort normal.  GI:  EFM 130s, +accels, occ early variables to 90s w/ ctx Ctx q 3 mins  Musculoskeletal: Normal range of motion.  Neurological: She is alert and oriented to person, place, and time.  Skin: Skin is warm and dry.  Psychiatric: She has a normal mood and affect. Her behavior is normal. Thought content normal.   Cx C/C/+2   Prenatal labs: ABO, Rh: B/Positive/-- (10/30 0000) Antibody: Negative (10/30 0000) Rubella: Immune (10/30 0000) RPR: NON REAC (01/08 0949)  HBsAg: Negative (10/30 0000)  HIV: NONREACTIVE (01/08 0949)  GBS:   neg 07/21/16  Assessment/Plan: IUP@  38.4wks Active labor/transition GBS neg  [facility] Prepare for SVD   Cindy House, Arlind Klingerman CNM 08/07/2016, 4:11 AM

## 2016-08-08 NOTE — Progress Notes (Signed)
Post Partum Day#1 Subjective: no complaints, up ad lib, voiding and tolerating PO She is having lots of cramping, normal bleeding and would like to stay until tomorrow. Objective: Blood pressure 123/73, pulse 61, temperature 97.6 F (36.4 C), temperature source Oral, resp. rate 18, height 5' 3.75" (1.619 m), weight 64 kg (141 lb), last menstrual period 11/22/2015, SpO2 100 %, unknown if currently breastfeeding.  Physical Exam:  General: alert Lochia: appropriate Uterine Fundus: firm and NT at U-1 DVT Evaluation: No evidence of DVT seen on physical exam.  No results for input(s): HGB, HCT in the last 72 hours.  Assessment/Plan: Plan for discharge tomorrow   LOS: 1 day   Cindy BossierMyra C Fenna House 08/08/2016, 8:33 AM

## 2016-08-08 NOTE — Lactation Note (Signed)
This note was copied from a baby's chart. Lactation Consultation Note  Patient Name: Boy Lovenia KimSonia Sorto Machuca ZOXWR'UToday's Date: 08/08/2016 Reason for consult: Follow-up assessment Interpreter used. Baby at 35 hr of life. Mom reports baby is latching well. She denies breast or nipple pain. She is worried that she has not been eating enough to make milk. She does not think baby gets enough from bf. She is bf on demand then offering supplement. She plans to continue offering formula at night so the baby will sleep longer and ebf during the day. Discussed maternal diet, baby behavior, feeding frequency, baby belly size, voids, wt loss, breast changes/plugged ducts/engorgement, and nipple care. Mom is aware of lactation services and support group.    Maternal Data    Feeding Length of feed: 15 min  LATCH Score/Interventions                      Lactation Tools Discussed/Used     Consult Status Consult Status: Follow-up Date: 08/09/16 Follow-up type: In-patient    Rulon Eisenmengerlizabeth E Kirk Sampley 08/08/2016, 4:06 PM

## 2016-08-09 ENCOUNTER — Encounter: Payer: Self-pay | Admitting: Family Medicine

## 2016-08-09 LAB — RPR: RPR Ser Ql: NONREACTIVE

## 2016-08-09 MED ORDER — IBUPROFEN 600 MG PO TABS: 600.0000 mg | ORAL_TABLET | Freq: Four times a day (QID) | ORAL | 0 refills | Status: DC

## 2016-08-09 MED ORDER — MEDROXYPROGESTERONE ACETATE 150 MG/ML IM SUSP
150.0000 mg | Freq: Once | INTRAMUSCULAR | Status: AC
  Administered 2016-08-09: 150 mg via INTRAMUSCULAR
  Filled 2016-08-09: qty 1

## 2016-08-09 NOTE — Lactation Note (Signed)
This note was copied from a baby's chart. Lactation Consultation Note  Patient Name: [name] ZOXWR'UToday's Date: 08/09/2016 Reason for consult: Follow-up assessment   Follow up with mom of 56 hour old infant. Spoke with mom with assistance of [employer] Royal, hospital interpreter. Mom reports BF is going well.   Mom reports her milk is coming in. She denies pain with feeding. Mom asked about amounts of formula to given infant, Feeding supplement sheet was reviewed with mom and converted to ounces for mom at her request. Enc mom to BF prior to offering formula and once milk is in she can BF only if she desires. Engorgement prevention/treatment, I/O, and Breast milk handling and storage reviewed.   [facility] and is to call and make an appt. Manual pump given with instructions for use and cleaning. Infant with follow up ped appt tomorrow. Mom without further questions/concerns. She has LC Brochure and is aware to call with any question/concerns prn.    Maternal Data Formula Feeding for Exclusion: Yes Reason for exclusion: Mother's choice to formula and breast feed on admission Has patient been taught Hand Expression?: Yes  Feeding Feeding Type: Breast Fed Length of feed: 30 min  LATCH Score/Interventions                      Lactation Tools Discussed/Used WIC Program: Yes Pump Review: Setup, frequency, and cleaning;Milk Storage   Consult Status Consult Status: Complete Follow-up type: Call as needed    Ed BlalockSharon S Rasheen Schewe 08/09/2016, 12:28 PM

## 2016-08-09 NOTE — Discharge Instructions (Signed)
Información sobre el dispositivo intrauterino °(Intrauterine Device Information) °Un dispositivo intrauterino (DIU) se inserta en el útero e impide el embarazo. Hay dos tipos de DIU: °· DIU de cobre: este tipo de DIU está recubierto con un alambre de cobre y se inserta dentro del útero. El cobre hace que el útero y las trompas de Falopio produzcan un liquido que destruye los espermatozoides. El DIU de cobre puede permanecer en el lugar durante 10 años. °· DIU con hormona: este tipo de DIU contiene la hormona progestina (progesterona sintética). Las hormonas hacen que el moco cervical se haga más espeso, lo que evita que el esperma ingrese al útero. También hace que la membrana que recubre internamente al útero sea más delgada lo que impide el implante del óvulo fertilizado. La hormona debilita o destruye los espermatozoides que ingresan al útero. Alguno de los tipos de DIU hormonal pueden permanecer en el lugar durante 5 años y otros tipos pueden dejarse en el lugar por 3 años. °El médico se asegurará de que usted sea una buena candidata para usar el DIU. Converse con su médico acerca de los posibles efectos secundarios. °VENTAJASDEL DISPOSITIVO INTRAUTERINO °· El DIU es muy eficaz, reversible, de acción prolongada y de bajo mantenimiento. °· No hay efectos secundarios relacionados con el estrógeno. °· El DIU puede ser utilizado durante la lactancia. °· No está asociado con el aumento de peso. °· Funciona inmediatamente después de la inserción. °· El DIU hormonal funciona inmediatamente si se inserta dentro de los 7 días del inicio del período. Será necesario que utilice un método anticonceptivo adicional durante 7 días si el DIU hormonal se inserta en algún otro momento del ciclo. °· El DIU de cobre no interfiere con las hormonas femeninas. °· El DIU hormonal puede hacer que los períodos menstruales abundantes se hagan más ligeros y que haya menos cólicos. °· El DIU hormonal puede usarse durante 3 a 5 años. °· El  DIU de cobre puede usarse durante 10 años. °DESVENTAJASDEL DISPOSITIVO INTRAUTERINO °· El DIU hormonal puede estar asociado con patrones de sangrado irregular. °· El DIU de cobre puede hacer que el flujo menstrual más abundante y doloroso. °· Puede experimentar cólicos y sangrado vaginal después de la inserción. °Esta información no tiene como fin reemplazar el consejo del médico. Asegúrese de hacerle al médico cualquier pregunta que tenga. °Document Released: 11/04/2009 Document Revised: 09/08/2015 Document Reviewed: 11/05/2012 °Elsevier Interactive Patient Education © 2017 Elsevier Inc. °Instrucciones para la mamá sobre los cuidados en el hogar °(Home Care Instructions for Mom) °ACTIVIDAD °· Reanude sus actividades regulares de forma gradual. °· Descanse. Tome siestas cuando el bebé duerme. °· No levante objetos que pesen más de 10 libras (4,5 kg) hasta que el médico se lo autorice. °· Evite las actividades que demandan mucho esfuerzo y energía (que son extenuantes) hasta que el médico se lo autorice. Caminar a un ritmo tranquilo a moderado siempre es más seguro. °· Si tuvo un parto por cesárea: °¨ No pase la aspiradora, suba escaleras o conduzca un vehículo durante 4 o 6 semanas. °¨ Pídale a alguien que le brinde ayuda con las tareas domésticas hasta que pueda realizarlas por su cuenta. °¨ Haga ejercicios como se lo haya indicado el médico, si corresponde. °HEMORRAGIA VAGINAL °Probablemente continúe sangrando durante 4 o 6 semanas después del parto. Generalmente, la cantidad de sangre disminuye y el color se hace más claro con el transcurso del tiempo. Sin embargo, si usted está demasiado activa, el color de la sangre puede ser rojo brillante. Si necesita   cambiarse la compresa higiénica en menos de una hora o tiene coágulos grandes: °· Permanezca acostada. °· Eleve los pies. °· Coloque compresas frías en la zona inferior del abdomen. °· Haga reposo. °· Comuníquese con su médico. °Si está amamantando, podría  volver a tener su período entre las 8 semanas después del parto y el momento en que deje de amamantar. Si no está amamantando, volverá a tener su período 6 u 8 semanas después del parto. °CUIDADOS PERINEALES °La zona perineal o perineo, es la parte del cuerpo que se encuentra entre los muslos. Después del parto, esta zona necesita un cuidado especial. Siga las siguientes indicaciones como se lo haya indicado su médico. °· Tome baños de inmersión durante 15 o 20 minutos. °· Utilice apósitos o aerosoles analgésicos y cremas como se lo hayan indicado. °· No utilice tampones ni se haga duchas vaginales hasta que el sangrado vaginal se haya detenido. °· Cada vez que vaya al baño: °¨ Use una botella perineal. °¨ Cámbiese el apósito. °¨ Use papel tisú en lugar de papel higiénico hasta que se cure la sutura. °· Haga ejercicios de Kegel todos los días. Los ejercicios Kegel ayudan a mantener los músculos que sostienen la vagina, la vejiga y los intestinos. Estos ejercicios se pueden realizar mientras está parada, sentada o acostada. Para hacer los ejercicios de Kegel: °¨ Tense los músculos del estómago y los que rodean el canal de parto. °¨ Mantenga esta posición durante unos segundos. °¨ Relájese. °¨ Repita hasta hacerlos 5 veces seguidas. °· Para evitar las hemorroides o que estas empeoren: °¨ Beba suficiente líquido para mantener la orina clara o de color amarillo pálido. °¨ Evite hacer fuerza al defecar. °¨ Tome los medicamentos y laxantes de venta libre como se lo haya indicado el médico. °CUIDADO DE LAS MAMAS °· Use un buen sostén. °· Evite tomar analgésicos de venta libre para las molestias de los pechos. °· Aplique hielo en los pechos para aliviar las molestias tanto como sea necesario: °¨ Ponga el hielo en una bolsa plástica. °¨ Coloque una toalla entre la piel y la bolsa de hielo. °¨ Aplique el hielo durante 20, o como se lo haya indicado el médico. °NUTRICIÓN °· Mantenga una dieta bien balanceada. °· No intente  perder de peso rápidamente reduciendo el consumo de calorías. °· Tome sus vitaminas prenatales hasta el control de postparto o hasta que su médico se lo indique. °DEPRESIÓN POSTPARTO °Puede sentir deseos de llorar sin motivo aparente y verse incapaz de enfrentarse a todos los cambios que implica tener un bebé. Este estado de ánimo se llama depresión postparto. La depresión postparto ocurre porque sus niveles hormonales sufren cambios después del parto. Si usted tiene depresión postparto, busque contención por parte de su pareja, sus amigos y su familia. Si la depresión no desaparece por sí sola después de algunas semanas, concurra a su médico. °AUTOEXAMEN DE MAMAS °Realícese autoexámenes en el mismo momento cada mes. Si está amamantando, el mejor momento de controlar sus mamas es después de alimentar al bebé, cuando los pechos no están tan llenos. Si está amamantando y su período ya comenzó, controle sus mamas el día 5, 6 o 7 de su período. °Informe a su médico de cualquier protuberancia, bulto o secreción. Si está amamantando, las mamas normalmente tienen bultos. Esto es transitorio y no es un riesgo para la salud. °INTIMIDAD Y SEXUALIDAD °Debe evitar las relaciones sexuales durante al menos 3 o 4 semanas después del parto o hasta que el flujo de color rojo   amarronado haya desaparecido completamente. Si no desea quedar embarazada nuevamente, use algún método anticonceptivo. Después del parto, puede quedar embarazada incluso si no ha tenido todavía el período. °SOLICITE ATENCIÓN MÉDICA SI: °· Se siente incapaz de controlar los cambios que implica tener un hijo y esos sentimientos no desaparecen después de algunas semanas. °· Detecta una protuberancia, bulto o secreción en sus mamas. °SOLICITE ATENCIÓN MÉDICA DE INMEDIATO SI: °· Debe cambiarse la compresa higiénica en 1 hora o menos. °· Tiene los siguientes síntomas: °¨ Dolor intenso o calambres en la parte inferior del abdomen. °¨ Una secreción vaginal con mal  olor. °¨ Fiebre que no se alivia con los medicamentos. °¨ Una zona de la mama se pone roja y le causa dolor, y además usted tiene fiebre. °¨ Una pantorrilla enrojecida y con dolor. °¨ Repentino e intenso dolor en el pecho. °¨ Falta de aire. °¨ Micción dolorosa o con sangre. °¨ Problemas visuales. °· Vómitos durante 12 horas o más. °· Dolor de cabeza intenso. °· Tiene pensamientos serios acerca de lastimarse a usted misma o dañar al niño o a otra persona. °Esta información no tiene como fin reemplazar el consejo del médico. Asegúrese de hacerle al médico cualquier pregunta que tenga. °Document Released: 05/17/2005 Document Revised: 09/08/2015 Document Reviewed: 11/18/2014 °Elsevier Interactive Patient Education © 2017 Elsevier Inc. ° °

## 2016-08-09 NOTE — Discharge Summary (Signed)
Obstetric Discharge Summary Reason for Admission: 38+ weeks EGA SOL Prenatal Procedures: ultrasound Intrapartum Procedures: spontaneous vaginal delivery Postpartum Procedures: none Complications-Operative and Postpartum: none Hemoglobin  Date Value Ref Range Status  06/07/2016 10.2 (L) 11.7 - 15.5 g/dL Final   HCT  Date Value Ref Range Status  06/07/2016 31.1 (L) 35.0 - 45.0 % Final    Physical Exam:  General: alert Lochia: appropriate Uterine Fundus: firm and NT at U-2 DVT Evaluation: No evidence of DVT seen on physical exam.  Discharge Diagnoses: Term Pregnancy-delivered  Discharge Information: Date: 08/09/2016 Activity: pelvic rest Diet: routine Medications: PNV, Ibuprofen and depo provera prior to discharge Condition: stable Instructions: refer to practice specific booklet Discharge to: home  She is considering an IUD at postpartum visit. Follow-up Information    Sharen CounterLisa Leftwich-Kirby, CNM. Schedule an appointment as soon as possible for a visit in 6 week(s).   Specialty:  Obstetrics and Gynecology Contact information: [address] [place] [id number] [phone]           Newborn Data: Live born female  Birth Weight: 6 lb 5.9 oz (2890 g) APGAR: 8, 9  Home with mother.  Cindy BossierMyra C Boy House 08/09/2016, 7:21 AM

## 2016-08-18 ENCOUNTER — Encounter: Payer: Self-pay | Admitting: Family Medicine

## 2016-09-16 ENCOUNTER — Encounter: Payer: Self-pay | Admitting: Student

## 2016-09-16 ENCOUNTER — Ambulatory Visit (INDEPENDENT_AMBULATORY_CARE_PROVIDER_SITE_OTHER): Payer: Self-pay | Admitting: Clinical

## 2016-09-16 ENCOUNTER — Ambulatory Visit (INDEPENDENT_AMBULATORY_CARE_PROVIDER_SITE_OTHER): Payer: Self-pay | Admitting: Student

## 2016-09-16 VITALS — BP 88/64 | HR 75 | Ht 63.0 in | Wt 123.5 lb

## 2016-09-16 DIAGNOSIS — F329 Major depressive disorder, single episode, unspecified: Secondary | ICD-10-CM

## 2016-09-16 DIAGNOSIS — F32A Depression, unspecified: Secondary | ICD-10-CM

## 2016-09-16 DIAGNOSIS — O99345 Other mental disorders complicating the puerperium: Secondary | ICD-10-CM

## 2016-09-16 DIAGNOSIS — R300 Dysuria: Secondary | ICD-10-CM

## 2016-09-16 DIAGNOSIS — F53 Postpartum depression: Secondary | ICD-10-CM | POA: Insufficient documentation

## 2016-09-16 DIAGNOSIS — Z113 Encounter for screening for infections with a predominantly sexual mode of transmission: Secondary | ICD-10-CM

## 2016-09-16 DIAGNOSIS — F4323 Adjustment disorder with mixed anxiety and depressed mood: Secondary | ICD-10-CM

## 2016-09-16 LAB — POCT URINALYSIS DIP (DEVICE)
Bilirubin Urine: NEGATIVE
Glucose, UA: NEGATIVE mg/dL
Ketones, ur: NEGATIVE mg/dL
Nitrite: NEGATIVE
PH: 5.5 (ref 5.0–8.0)
PROTEIN: NEGATIVE mg/dL
SPECIFIC GRAVITY, URINE: 1.02 (ref 1.005–1.030)
UROBILINOGEN UA: 0.2 mg/dL (ref 0.0–1.0)

## 2016-09-16 NOTE — Patient Instructions (Signed)
Depresión posparto y baby blues  (Postpartum Depression and Baby Blues)  El período del posparto comienza inmediatamente después del nacimiento de un bebé y suele ser una época de gran felicidad y mucho entusiasmo. También es tiempo de muchos cambios en la vida de los padres. Sin importar cuántos hijos tenga una madre, cada niño plantea nuevos desafíos y una nueva dinámica a la familia. Es frecuente que haya sentimientos de entusiasmo junto con cambios confusos en el estado de ánimo, las emociones y los pensamientos. Todas las madres corren riesgo de tener depresión posparto o "baby blues". Estos cambios en el estado de ánimo pueden presentarse inmediatamente después del parto o muchos meses después del nacimiento de un niño. El baby blues o la depresión posparto pueden ser leves o graves. Además, la depresión posparto puede desaparecer con bastante rapidez o puede ser una enfermedad de larga evolución.  CAUSAS  Se cree que el aumento de las concentraciones hormonales y su rápida disminución es la causa principal de la depresión posparto y el baby blues. Durante y después del embarazo, un número de hormonas cambian. El estrógeno y la progesterona generalmente disminuyen inmediatamente después del parto. También disminuyen con rapidez las concentraciones de la hormona tiroidea y de varios esteroides del cortisol. Otros factores que juegan un papel en estos cambios anímicos incluyen los sucesos importantes de la vida y la genética.  FACTORES DE RIESGO  Si tiene alguno de los siguientes riesgos de desarrollar baby blues o depresión posparto, sepa a qué síntomas debe estar atenta durante el período del posparto. Los factores de riesgo que pueden aumentar la probabilidad de tener baby blues o depresión posparto incluyen lo siguiente:  · Antecedentes personales o familiares de depresión.  · Depresión durante el embarazo.  · Problemas anímicos premenstruales o que guardan relación con los anticonceptivos orales.  · Mucho  estrés en la vida.  · Conflictos maritales.  · Falta de una red de apoyo social.  · Tener un bebé con necesidades especiales.  · Problemas de salud, como diabetes.  SIGNOS Y SÍNTOMAS  Los síntomas de baby blues incluyen lo siguiente:  · Cambios en el estado de ánimo durante lapsos breves, como pasar de la felicidad extrema a la tristeza.  · Falta de concentración.  · Dificultad para dormir.  · Ataques de llanto, sensibilidad emocional.  · Irritabilidad.  · Ansiedad.  Generalmente, los síntomas de depresión posparto comienzan en el término del primer mes después del parto. Estos síntomas incluyen:  · Dificultad para dormir o somnolencia excesiva.  · Pérdida de peso notable.  · Agitación.  · Sentimientos de inutilidad.  · Falta de interés en las actividades o la comida.  La psicosis posparto es una enfermedad muy grave que puede ser peligrosa. Afortunadamente, es poco frecuente. Si aparece alguno de los siguientes síntomas, se debe recibir atención médica de inmediato. Los síntomas de psicosis posparto incluyen lo siguiente:  · Alucinaciones y delirios.  · Comportamiento atípico o desorganizado.  · Confusión o desorientación.  DIAGNÓSTICO  El diagnóstico se realiza mediante la evaluación de los síntomas. No hay exámenes médicos ni pruebas de laboratorio que permitan hacer un diagnóstico, pero hay varios cuestionarios que el médico puede usar para identificar a las personas que tienen baby blues, depresión posparto o psicosis. A menudo se usa una herramienta de detección sistemática llamada Escala de depresión posnatal de [place] para diagnosticar la depresión durante el período del posparto.  TRATAMIENTO  Generalmente, el baby blues desaparece solo en el término de 1 o   2 semanas. A menudo, todo lo que se necesita es apoyo social. Se le recomendará que duerma y descanse lo suficiente. En ocasiones, se le pueden administrar medicamentos para ayudarla a dormir.  La depresión posparto requiere tratamiento porque puede  durar varios meses o más tiempo si no se la trata. El tratamiento puede incluir terapia individual o grupal, medicamentos o ambos para abordar los factores sociales, fisiológicos y psicológicos que pueden tener influencia en la depresión. También pueden recomendarse enfáticamente el ejercicio regular, una alimentación sana, el descanso y el apoyo social.  La psicosis posparto es una enfermedad más grave y requiere tratamiento inmediato. A menudo es necesaria la hospitalización.  INSTRUCCIONES PARA EL CUIDADO EN EL HOGAR  · Descanse todo lo posible. Tome una siesta cuando el bebé duerme.  · Haga ejercicios regularmente. Para algunas mujeres, el yoga y las caminatas son beneficiosas.  · Consuma una dieta equilibrada y nutritiva.  · Haga pequeñas cosas que disfruta. Tome una taza de té, dese un baño de burbujas, lea su revista favorita o escuche su música predilecta.  · Evite el alcohol.  · Pida ayuda con los quehaceres domésticos, la cocina, las compras de comida o las obligaciones diarias si lo necesita. No intente hacer todo.  · Hable con personas allegadas sobre cómo se siente. Busque el apoyo de su pareja, sus familiares, sus amigos o de otras madres primerizas.  · Intente pensar positivamente. Piense en aquellas cosas por las que se siente agradecida.  · No pase mucho tiempo sola.  · Tome solo medicamentos de venta libre o recetados, según las indicaciones del médico.  · Cumpla con todos los controles del postparto.  · Infórmele a su médico sobre cualquier inquietud que tenga.    SOLICITE ATENCIÓN MÉDICA SI:  Tiene una reacción al medicamento o problemas con este.  SOLICITE ATENCIÓN MÉDICA DE INMEDIATO SI:  · Tiene sentimientos suicidas.  · Cree que podría lastimar al bebé o a otra persona.    ASEGÚRESE DE QUE:  · Comprende estas instrucciones.  · Controlará su afección.  · Recibirá ayuda de inmediato si no mejora o si empeora.    Esta información no tiene como fin reemplazar el consejo del médico. Asegúrese de  hacerle al médico cualquier pregunta que tenga.  Document Released: 11/03/2007 Document Revised: 05/22/2013 Document Reviewed: 02/26/2013  Elsevier Interactive Patient Education © 2017 Elsevier Inc.

## 2016-09-16 NOTE — BH Specialist Note (Signed)
Integrated Behavioral Health Initial Visit  MRN: [id number] Name: Cindy House [facility]   Session Start time: 4:15 Session End time: 5:12 Total time: 30 minutes  Type of Service: Integrated Behavioral Health- Individual/Family Interpretor:Yes.   Interpretor Name and Language: Irving Shows, Spanish   Warm Hand Off Completed.       SUBJECTIVE: Suhailah Kwan Sorto Nada House is a 26 y.o. female accompanied by 24yo and newborn. Patient was referred by Luna Kitchens, CNM for depression. Patient reports the following symptoms/concerns: Pt states that her primary concern is feeling tired, depressed, and lack of sleep, attributed to not feeling comfortable sleeping when older child is awake; no social support locally. 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 Patient may benefit from psychoeducation and brief therapeutic intervention regarding coping with symptoms of anxiety and depression.  PLAN: 1. Follow up with behavioral health clinician on : Phone f/u in one week, will determine additional f/u at that time 2. Behavioral recommendations:  -Consider sleep app for self and older son, for improved evening sleep -Continue  taking iron with orange juice, as recommended by medical provider -Consider Mom Talk support group, Tuesdays at 10am, [facility] -Read educational material regarding coping with symptoms of depression -Consider [facility] [place] or [facility] for ongoing therapy, if symptoms continue 3. Referral(s): Integrated Art gallery manager (In Clinic) and [employer]:  Mom Talk support group 4. "From scale of 1-10, how likely are you to follow plan?": 7  Rae Lips, LCSWA   Depression screen [facility] 2/9 09/16/2016 07/29/2016 07/21/2016 07/05/2016 04/13/2016  Decreased Interest 3 1 0 0 1  Down, Depressed, Hopeless 3 0 0 0 0  PHQ - 2 Score 6 1 0 0 1  Altered sleeping 0  Tired, decreased energy 3 1 0 1 1  Change in appetite 2 0 1 0 1  Feeling bad or failure about yourself  2 0 0 0 0  Trouble concentrating 3 0 0 0 1  Moving slowly or fidgety/restless 0 0 0 0 0  Suicidal thoughts 1 0 0 0 0  PHQ-9 Score Difficult doing work/chores Very difficult - - - -   GAD 7 : Generalized Anxiety Score 09/16/2016 07/29/2016 07/21/2016 07/05/2016  Nervous, Anxious, on Edge 2 0 0 0  Control/stop worrying 2 0 0 0  Worry too much - different things 3 0 0 0  Trouble relaxing 3 0 0 0  Restless 0 0 0 0  Easily annoyed or irritable 3 0 0 1  Afraid - awful might happen 3 0 0 0  Total GAD 7 Score 16 0 0 1

## 2016-09-16 NOTE — Progress Notes (Signed)
Subjective:     Cindy House is a 26 y.o. female who presents for a postpartum visit. She is 5 weeks postpartum following a spontaneous vaginal delivery. I have fully reviewed the prenatal and intrapartum course. The delivery was at 38.4 gestational weeks. Outcome: spontaneous vaginal delivery. Anesthesia: none. Postpartum course has been uneventful. Baby's course has been unremarkable. Baby is feeding by both breast and bottle - Similac Advance. Bleeding staining only. Bowel function is normal. Bladder function is abnormal: patient reports dysuria. Patient is not sexually active. Contraception method is none. Patient is interested in the IUD. Depression/Anxiety screening: positive.  Patient reports headaches with dizziness & blurry vision, that is minimally relieved with motrin. She says that she is having some burning when she urinates.   Review of Systems Pertinent items are noted in HPI.   Objective:    Vitals:   09/16/16 1607  BP: (!) 88/64  Pulse: 75    General:  alert, cooperative and no distress   Breasts:  inspection negative, no nipple discharge or bleeding, no masses or nodularity palpable  Lungs: clear to auscultation bilaterally  Heart:  regular rate and rhythm, S1, S2 normal, no murmur, click, rub or gallop  Abdomen: soft, non-tender; bowel sounds normal; no masses,  no organomegaly   Vulva:  normal  Vagina: normal vagina scant dark brown blood in the vagina  Cervix:  multiparous appearance  Corpus: normal size, contour, position, consistency, mobility, non-tender  Adnexa:  not evaluated  Rectal Exam: Not performed.        Assessment:    Normal postpartum exam. Pap smear not done at today's visit.   Plan:    1. Contraception: Depo-Provera injections but wants the IUD. Referred her to the health department for her IUD placement.  2. Urine sent for culture and GC CT pending 2. Patient to see Asher Muir to discuss her PPD screening.  3. Follow up in: 1  year or as needed.

## 2016-09-17 LAB — GC/CHLAMYDIA PROBE AMP (~~LOC~~) NOT AT ARMC
Chlamydia: NEGATIVE
Neisseria Gonorrhea: NEGATIVE

## 2016-09-18 LAB — URINE CULTURE

## 2016-09-23 ENCOUNTER — Telehealth: Payer: Self-pay | Admitting: Clinical

## 2016-09-23 NOTE — Telephone Encounter (Signed)
Per [employer], [phone], called pt for f/u mood check postpartum. Pt says that since last visit, she has gone to [place], that they are very supportive, her sleep has improved in the past week, feels more relaxed, and the depressed, tired feeling is lifting.   Pt aware that if her symptoms of depression do not go away and/or increase, she can come back to [facility] for appointment with [facility], or she may call either [facility] or [facility]. Pt says she still has the information, if needed, but that she feels her mood is improving with sleep, taking her iron pills, and extra support from her family.

## 2018-07-14 IMAGING — US US MFM OB TRANSVAGINAL
2 series · 13 of 28 positions shown · non-contrast
Comparison: none

[Series 1: us mfm ob transvaginal · 91 acquisitions, 12 frames shown (1 of 2)]
[im 4/91]
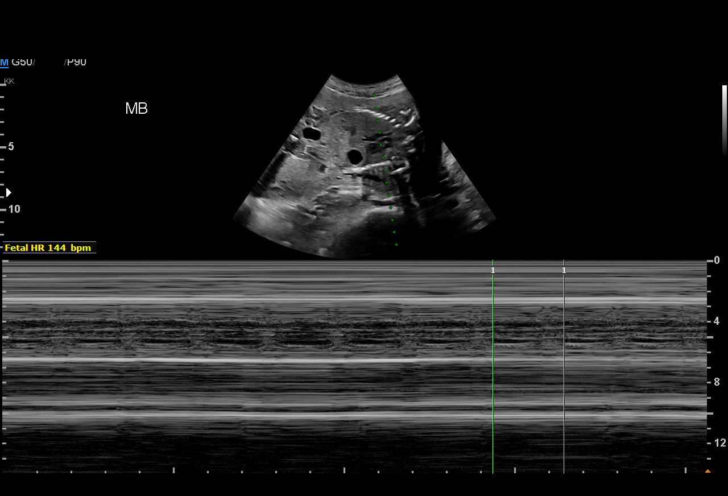
[im 11/91]
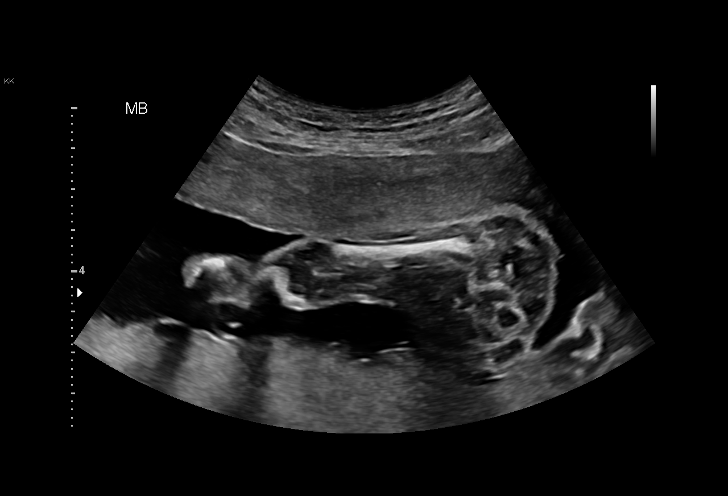
[im 19/91]
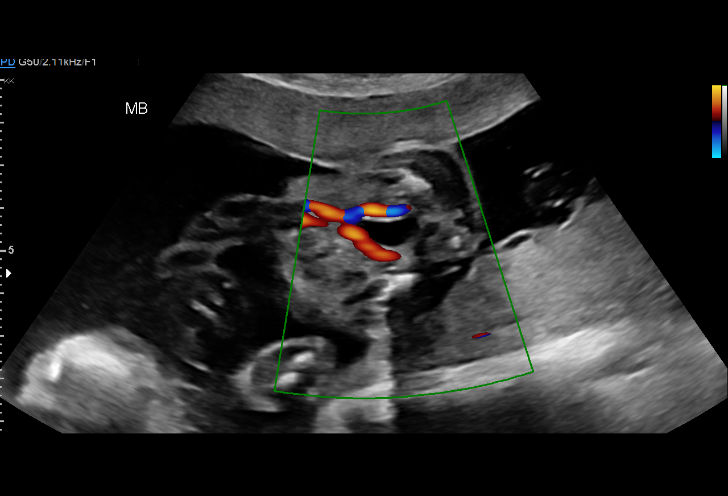
[im 26/91]
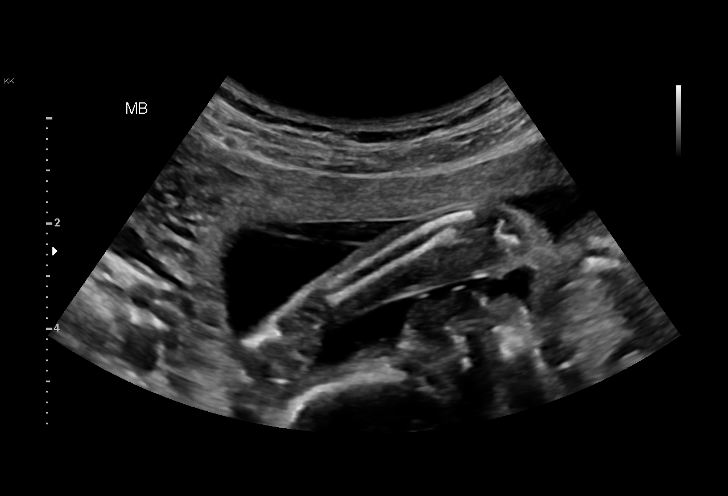
[im 33/91]
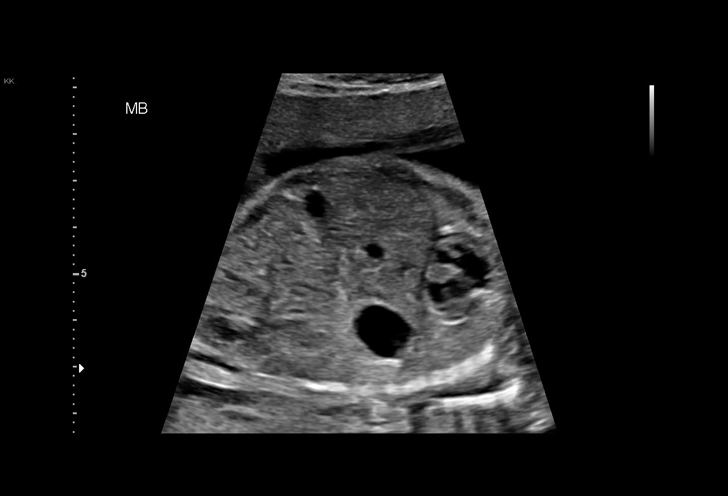
[im 40/91]
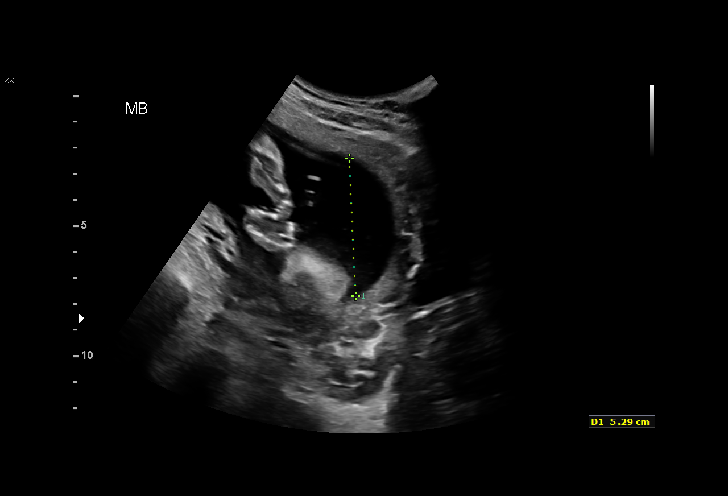
[im 51/91]
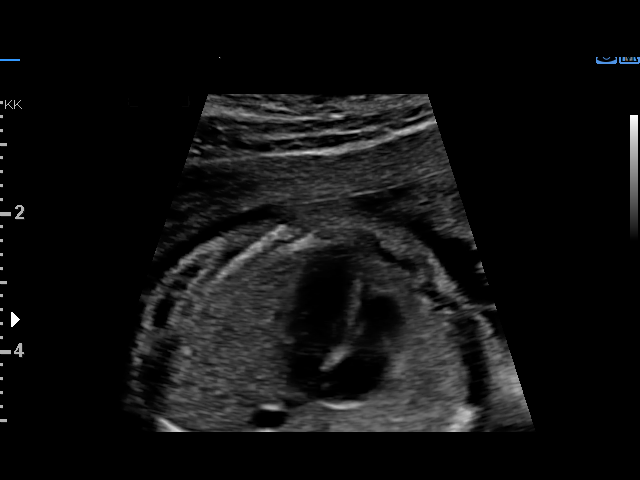
[im 58/91]
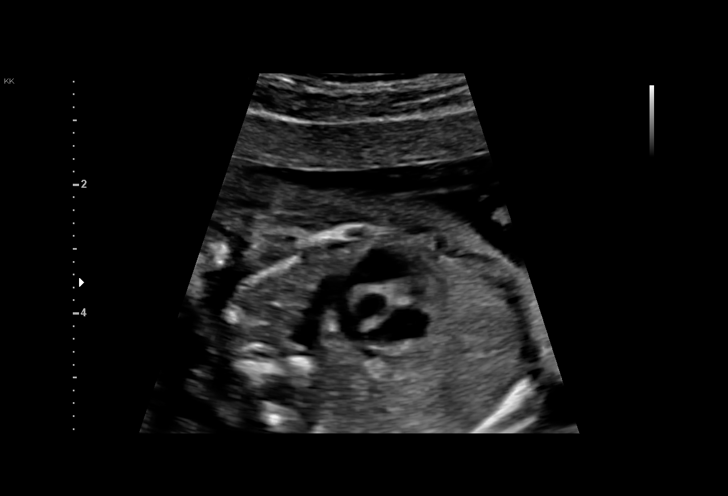
[im 65/91]
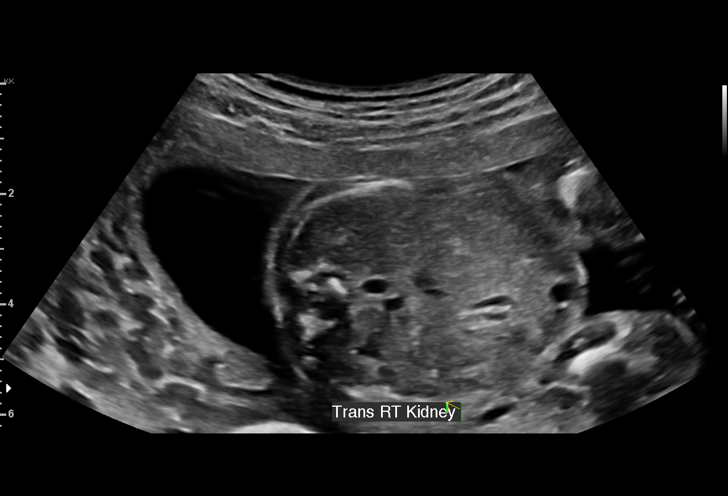
[im 73/91]
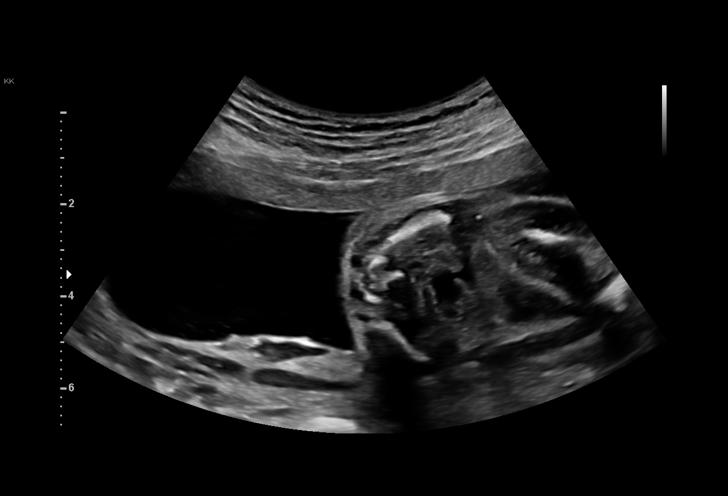
[im 80/91]
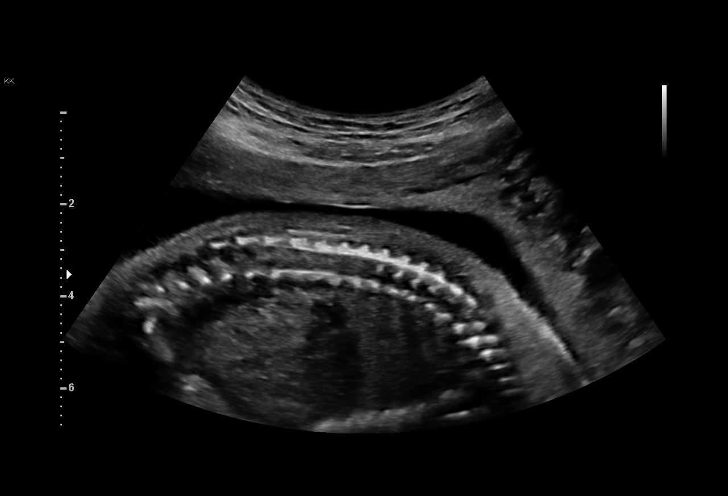
[im 87/91]
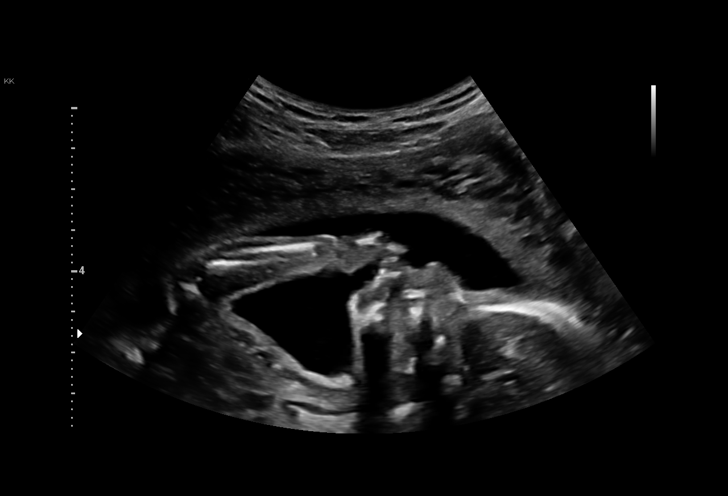

[Series 3: us mfm ob transvaginal · 1 of 6 slices shown (2 of 2)]
[im 1/6]
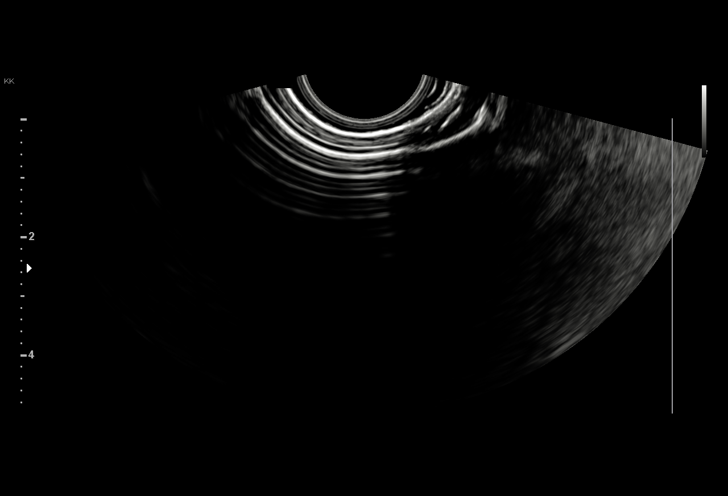

[13 of 28 positions shown; findings below may reference images not displayed]

pm)

[REDACTED]

1  SANTOS VELASQUEZ             [id number]      [id number]     [id number]
2  EM WALEED CHAIM             [id number]      [id number]     [id number]
Indications

22 weeks gestation of pregnancy
Encounter for antenatal screening for
malformations
Encounter for cervical length
Poor obstetric history: Previous preterm
delivery, antepartum (36 weeks)
OB History

Gravidity:    2         Term:   0        Prem:   1        SAB:   0
TOP:          0       Ectopic:  0        Living: 1
Fetal Evaluation

Num Of Fetuses:     1
Fetal Heart         144
Rate(bpm):
Cardiac Activity:   Observed
Presentation:       Cephalic
Placenta:           Posterior, above cervical os
P. Cord Insertion:  Visualized

Amniotic Fluid
AFI FV:      Subjectively within normal limits

Largest Pocket(cm)
5.3
Biometry

BPD:      52.7  mm     G. Age:  22w 0d         41  %    CI:        72.35   %    70 - 86
FL/HC:      17.3   %    18.4 -
HC:      197.1  mm     G. Age:  21w 6d         28  %    HC/AC:      1.11        1.06 -
AC:      177.5  mm     G. Age:  22w 5d         58  %    FL/BPD:     64.5   %    71 - 87
FL:         34  mm     G. Age:  20w 5d          6  %    FL/AC:      19.2   %    20 - 24

Est. FW:     451  gm           1 lb     37  %
Gestational Age

LMP:           20w 4d        Date:  11/22/15                 EDD:   08/28/16
U/S Today:     21w 6d                                        EDD:   08/19/16
Best:          22w 1d     Det. By:  Early Ultrasound         EDD:   08/17/16
(01/02/16)
Anatomy

Cranium:               Appears normal         Aortic Arch:            Appears normal
Cavum:                 Appears normal         Ductal Arch:            Appears normal
Ventricles:            Appears normal         Diaphragm:              Appears normal
Choroid Plexus:        Appears normal         Stomach:                Appears normal, left
sided
Cerebellum:            Appears normal         Abdomen:                Appears normal
Posterior Fossa:       Appears normal         Abdominal Wall:         Appears nml (cord
insert, abd wall)
Nuchal Fold:           Not applicable (>20    Cord Vessels:           Appears normal (3
wks GA)                                        vessel cord)
Face:                  Appears normal         Kidneys:                Appear normal
(orbits and profile)
Lips:                  Appears normal         Bladder:                Appears normal
Thoracic:              Appears normal         Spine:                  Appears normal
Heart:                 Appears normal         Upper Extremities:      Appears normal
(4CH, axis, and situs
RVOT:                  Appears normal         Lower Extremities:      Appears normal
LVOT:                  Appears normal

Other:  Male gender. Heels and 5th digit visualized.
Cervix Uterus Adnexa

Cervix
Length:            3.8  cm.
Measured transvaginally.

Adnexa:       Not Visualized
Impression

Singleton intrauterine pregnancy at 22+1 weeks, with history
of previous late PTD
Review of the anatomy shows no sonographic markers for
aneuploidy or structural anomalies
Amniotic fluid volume is normal
Estimated fetal weight is 451g which is growth in the 37th
percentile
Transvaginal cervical length was 38mm with no funnelling
and no changes with transfundal pressure
Recommendations

Normal growth and development with reassuring cervical
legth. Consider repeat cervical length prior to 28 weeks.

## 2019-09-13 ENCOUNTER — Encounter: Payer: Self-pay | Admitting: *Deleted

## 2022-04-07 ENCOUNTER — Ambulatory Visit: Payer: Self-pay

## 2022-04-07 DIAGNOSIS — Z23 Encounter for immunization: Secondary | ICD-10-CM
